# Patient Record
Sex: Female | Born: 1990 | Race: White | Hispanic: No | Marital: Married | Smoking: Former smoker
Health system: Southern US, Community
[De-identification: ages and names within clinical notes are randomized; demographics above are authoritative.]

## PROBLEM LIST (undated history)

## (undated) ENCOUNTER — Ambulatory Visit: Discharge status: Absent without leave | Payer: Self-pay

## (undated) DIAGNOSIS — F419 Anxiety disorder, unspecified: Secondary | ICD-10-CM

## (undated) DIAGNOSIS — K219 Gastro-esophageal reflux disease without esophagitis: Secondary | ICD-10-CM

---
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
## 2014-11-03 NOTE — L&D Delivery Note (Signed)
Deliver Note   Date of Delivery:   05/26/2015 Primary OB:   [facility] Gestational Age/EDD: [redacted]w[redacted]d by 05/28/2015, by Last Menstrual Period  Antepartum complications:  OB History    Gravida Para Term Preterm AB TAB SAB Ectopic Multiple Living   0 0 0 0 0 0 0      Delivered By:   Vena Austria MD  Delivery Type:   TSVD Anesthesia:     Epidural  Intrapartum complications:  GBS:    Negative (06/29 0000) Laceration:    Small bilateral labia minor - hemostatic not repaired Episiotomy:    none Placenta:    Spontaneous Estimated Blood Loss:  Baby:    Liveborn female , APGAR (1 MIN): 9   APGAR (5 MINS): 9   APGAR (10 MINS):  , weight pending   Deliver Details   At  a female Bryan Lemma) was delivered via  (Presentation: OA  ).  APGAR: 9, 9; weight pending.   Placenta status: intact, 3 Vessel Cord:  without complications: .Light meconium noted in labor    Mom to postpartum.  Baby to Couplet care / Skin to Skin.

## 2014-11-07 LAB — OB RESULTS CONSOLE GC/CHLAMYDIA
CHLAMYDIA, DNA PROBE: NEGATIVE
GC PROBE AMP, GENITAL: NEGATIVE

## 2014-12-20 LAB — OB RESULTS CONSOLE HGB/HCT, BLOOD
HCT: 36 %
Hemoglobin: 11.9 g/dL

## 2014-12-20 LAB — OB RESULTS CONSOLE VARICELLA ZOSTER ANTIBODY, IGG: Varicella: IMMUNE

## 2014-12-20 LAB — OB RESULTS CONSOLE ABO/RH: RH TYPE: POSITIVE

## 2014-12-20 LAB — OB RESULTS CONSOLE RPR: RPR: NONREACTIVE

## 2014-12-20 LAB — OB RESULTS CONSOLE HIV ANTIBODY (ROUTINE TESTING): HIV: NONREACTIVE

## 2014-12-20 LAB — OB RESULTS CONSOLE RUBELLA ANTIBODY, IGM: Rubella: IMMUNE

## 2014-12-20 LAB — OB RESULTS CONSOLE HEPATITIS B SURFACE ANTIGEN: Hepatitis B Surface Ag: NEGATIVE

## 2014-12-20 LAB — OB RESULTS CONSOLE ANTIBODY SCREEN: Antibody Screen: NEGATIVE

## 2015-05-02 LAB — OB RESULTS CONSOLE GBS: GBS: NEGATIVE

## 2015-05-26 ENCOUNTER — Inpatient Hospital Stay: Payer: Medicaid Other | Admitting: Anesthesiology

## 2015-05-26 ENCOUNTER — Inpatient Hospital Stay
Admission: EM | Admit: 2015-05-26 | Discharge: 2015-05-28 | DRG: 775 | Disposition: A | Discharge status: Home / Self Care | Payer: Medicaid Other | Attending: Obstetrics and Gynecology | Admitting: Obstetrics and Gynecology

## 2015-05-26 DIAGNOSIS — K219 Gastro-esophageal reflux disease without esophagitis: Secondary | ICD-10-CM | POA: Diagnosis present

## 2015-05-26 DIAGNOSIS — Z87891 Personal history of nicotine dependence: Secondary | ICD-10-CM | POA: Diagnosis not present

## 2015-05-26 DIAGNOSIS — Z833 Family history of diabetes mellitus: Secondary | ICD-10-CM

## 2015-05-26 DIAGNOSIS — Z3A39 39 weeks gestation of pregnancy: Secondary | ICD-10-CM | POA: Diagnosis present

## 2015-05-26 HISTORY — DX: Gastro-esophageal reflux disease without esophagitis: K21.9

## 2015-05-26 LAB — CHLAMYDIA/NGC RT PCR (ARMC ONLY)
Chlamydia Tr: NOT DETECTED
N GONORRHOEAE: NOT DETECTED

## 2015-05-26 LAB — URINE DRUG SCREEN, QUALITATIVE (ARMC ONLY)
Amphetamines, Ur Screen: NOT DETECTED
Barbiturates, Ur Screen: NOT DETECTED
Benzodiazepine, Ur Scrn: NOT DETECTED
CANNABINOID 50 NG, UR ~~LOC~~: POSITIVE — AB
Cocaine Metabolite,Ur ~~LOC~~: NOT DETECTED
MDMA (ECSTASY) UR SCREEN: NOT DETECTED
Methadone Scn, Ur: NOT DETECTED
OPIATE, UR SCREEN: NOT DETECTED
Phencyclidine (PCP) Ur S: NOT DETECTED
Tricyclic, Ur Screen: NOT DETECTED

## 2015-05-26 LAB — ABO/RH: ABO/RH(D): A POS

## 2015-05-26 LAB — CBC
HEMATOCRIT: 36.8 % (ref 35.0–47.0)
HEMOGLOBIN: 12.2 g/dL (ref 12.0–16.0)
MCH: 29.8 pg (ref 26.0–34.0)
MCHC: 33 g/dL (ref 32.0–36.0)
MCV: 90.1 fL (ref 80.0–100.0)
Platelets: 184 10*3/uL (ref 150–440)
RBC: 4.09 MIL/uL (ref 3.80–5.20)
RDW: 13.5 % (ref 11.5–14.5)
WBC: 16.4 10*3/uL — ABNORMAL HIGH (ref 3.6–11.0)

## 2015-05-26 LAB — TYPE AND SCREEN
ABO/RH(D): A POS
ANTIBODY SCREEN: NEGATIVE

## 2015-05-26 MED ORDER — DIPHENHYDRAMINE HCL 50 MG/ML IJ SOLN: 12.5000 mg | INTRAMUSCULAR | Status: DC | PRN

## 2015-05-26 MED ORDER — BUTORPHANOL TARTRATE 1 MG/ML IJ SOLN
1.0000 mg | INTRAMUSCULAR | Status: DC | PRN
  Administered 2015-05-26 (×2): 2 mg via INTRAVENOUS
  Filled 2015-05-26 (×2): qty 2

## 2015-05-26 MED ORDER — FENTANYL 2.5 MCG/ML W/ROPIVACAINE 0.2% IN NS 100 ML EPIDURAL INFUSION (ARMC-ANES)
10.0000 mL/h | EPIDURAL | Status: DC
  Administered 2015-05-26: 10 mL/h via EPIDURAL
  Filled 2015-05-26: qty 100

## 2015-05-26 MED ORDER — ONDANSETRON HCL 4 MG/2ML IJ SOLN
4.0000 mg | Freq: Four times a day (QID) | INTRAMUSCULAR | Status: DC | PRN
  Administered 2015-05-26 (×2): 4 mg via INTRAVENOUS
  Filled 2015-05-26 (×2): qty 2

## 2015-05-26 MED ORDER — CITRIC ACID-SODIUM CITRATE 334-500 MG/5ML PO SOLN: 30.0000 mL | ORAL | Status: DC | PRN

## 2015-05-26 MED ORDER — FENTANYL 2.5 MCG/ML BUPIVACAINE 1/10 % EPIDURAL INFUSION (WH - ANES): 9.0000 mL/h | INTRAMUSCULAR | Status: DC | PRN

## 2015-05-26 MED ORDER — LACTATED RINGERS IV SOLN
INTRAVENOUS | Status: DC
  Administered 2015-05-26: 14:00:00 via INTRAVENOUS

## 2015-05-26 MED ORDER — LIDOCAINE HCL (PF) 1 % IJ SOLN
30.0000 mL | INTRAMUSCULAR | Status: DC | PRN
  Filled 2015-05-26: qty 30

## 2015-05-26 MED ORDER — AMMONIA AROMATIC IN INHA
RESPIRATORY_TRACT | Status: AC
  Filled 2015-05-26: qty 10

## 2015-05-26 MED ORDER — OXYTOCIN 10 UNIT/ML IJ SOLN
INTRAMUSCULAR | Status: AC
  Filled 2015-05-26: qty 2

## 2015-05-26 MED ORDER — LIDOCAINE HCL (PF) 1 % IJ SOLN
INTRAMUSCULAR | Status: AC
  Filled 2015-05-26: qty 30

## 2015-05-26 MED ORDER — TERBUTALINE SULFATE 1 MG/ML IJ SOLN: 0.2500 mg | Freq: Once | INTRAMUSCULAR | Status: AC | PRN

## 2015-05-26 MED ORDER — EPHEDRINE 5 MG/ML INJ
10.0000 mg | INTRAVENOUS | Status: DC | PRN
  Filled 2015-05-26: qty 2

## 2015-05-26 MED ORDER — BUPIVACAINE HCL (PF) 0.25 % IJ SOLN
INTRAMUSCULAR | Status: DC | PRN
  Administered 2015-05-26: 5 mL

## 2015-05-26 MED ORDER — OXYTOCIN 40 UNITS IN LACTATED RINGERS INFUSION - SIMPLE MED: 62.5000 mL/h | INTRAVENOUS | Status: DC

## 2015-05-26 MED ORDER — OXYTOCIN BOLUS FROM INFUSION
500.0000 mL | INTRAVENOUS | Status: DC
  Administered 2015-05-26: 500 mL via INTRAVENOUS

## 2015-05-26 MED ORDER — ACETAMINOPHEN 325 MG PO TABS: 650.0000 mg | ORAL_TABLET | ORAL | Status: DC | PRN

## 2015-05-26 MED ORDER — OXYTOCIN 40 UNITS IN LACTATED RINGERS INFUSION - SIMPLE MED
1.0000 m[IU]/min | INTRAVENOUS | Status: DC
  Administered 2015-05-26: 2 m[IU]/min via INTRAVENOUS
  Administered 2015-05-26: 3 m[IU]/min via INTRAVENOUS

## 2015-05-26 MED ORDER — LACTATED RINGERS IV SOLN: 500.0000 mL | INTRAVENOUS | Status: DC | PRN

## 2015-05-26 NOTE — H&P (Signed)
Obstetric H&P   Chief Complaint: Contractions  Prenatal Care Provider: [facility]  History of Present Illness: 24 y.o. G1P0000 [redacted]w[redacted]d by 05/28/2015, presenting to L&D with contraction.  No LOF, no VB, +FM.  Pregnancy thus far uncomplicated at [facility].    ABO, Rh: A/Positive/-- (02/17 0000)  Antibody: Negative (02/17 0000)  Rubella: Immune Varicella: Immune RPR: Nonreactive (02/17 0000)  HBsAg: Negative (02/17 0000)  HIV: Non-reactive (02/17 0000)  RPR: Nonreactive (02/17 0000) 1-hr: 87 GBS: Negative (06/29 0000)   TDAP received 03/15/15  Review of Systems: 10 point review of systems negative unless otherwise noted in HPI  Past Medical History: Past Medical History  Diagnosis Date  . GERD (gastroesophageal reflux disease)     Past Surgical History: Past Surgical History  Procedure Laterality Date  . No past surgeries      Family History: Family History  Problem Relation Age of Onset  . Diabetes Maternal Grandfather   . Diabetes Paternal Grandfather     Social History: History   Social History  . Marital Status: Single    Spouse Name: N/A  . Number of Children: N/A  . Years of Education: N/A   Occupational History  . Not on file.   Social History Main Topics  . Smoking status: Former Games developer  . Smokeless tobacco: Never Used  . Alcohol Use: No  . Drug Use: 1.00 per week    Special: Marijuana  . Sexual Activity: Yes   Other Topics Concern  . Not on file   Social History Narrative  . No narrative on file    Medications: Prior to Admission medications   Medication Sig Start Date End Date Taking? Authorizing Provider  lactase (LACTAID) 3000 UNITS tablet Take by mouth 3 (three) times daily with meals.   Yes Historical Provider, MD  Prenatal Vit-Fe Fumarate-FA (PRENATAL MULTIVITAMIN) TABS tablet Take 1 tablet by mouth daily at 12 noon.   Yes Historical Provider, MD  ranitidine (ZANTAC) 150 MG tablet Take 150 mg by mouth 2 (two) times daily.   Yes Historical  Provider, MD    Allergies: No Known Allergies  Physical Exam: Vitals: Blood pressure 117/84, pulse 87, temperature 98.2 F (36.8 C), temperature source Oral, resp. rate 16, height  (1.702 m), weight 87.091 kg (192 lb), last menstrual period 08/21/2014.   FHT: 130, moderate, positive accels, no decels Toco: q19min  General: NAD HEENT: Normocephalic, anicteric Pulmonary: CTAB Cardiovascular: RRR Abdomen: Gravid,  Non-tender Leopolds: vtx Genitourinary: Dilation: 4 Effacement (%): 80 Cervical Position: Anterior Station: -1 Presentation: Vertex Exam by:: JTC  Extremities: no edema  Labs: No results found for this or any previous visit (from the past 24 hour(s)).  Assessment: 24 y.o. G1P0000 [redacted]w[redacted]d by 05/28/2015, by Last Menstrual Period with term labor  Plan: 1) Labor - expectant management, change from 3-4cm  2) Fetus - category I tracing   4) TDAP - up to date  5) Breast / Mirena  6)  Disposition - pending delivery

## 2015-05-26 NOTE — Progress Notes (Signed)
Dr. Bonney Aid at bedside.  Pt pushing effectively.    2318 Delivered female over intact perineum.

## 2015-05-26 NOTE — Progress Notes (Signed)
Pt is complete. Foley dc'd.  Pt positioned to push.  MD aware

## 2015-05-26 NOTE — Anesthesia Procedure Notes (Signed)
Epidural Patient location during procedure: OB  Staffing Anesthesiologist: Berdine Addison Performed by: anesthesiologist   Preanesthetic Checklist Completed: patient identified, site marked, surgical consent, pre-op evaluation, timeout performed, IV checked, risks and benefits discussed and monitors and equipment checked  Epidural Patient position: sitting Prep: Betadine Patient monitoring: heart rate, continuous pulse ox and blood pressure Approach: midline Location: L4-L5 Injection technique: LOR saline  Needle:  Needle type: Tuohy  Needle gauge: 18 G Needle length: 9 cm and 9 Catheter type: closed end flexible Catheter size: 20 Guage Test dose: negative and 1.5% lidocaine with Epi 1:200 K  Assessment Sensory level: T10 Events: blood not aspirated, injection not painful, no injection resistance, negative IV test and no paresthesia  Additional Notes   Patient tolerated the insertion well without complications. 1707 catheter in. 1709 test dose. 1713 5 ml 0.25% marcaine.Reason for block:procedure for pain

## 2015-05-26 NOTE — OB Triage Note (Signed)
24 y.o. female presents today with complaint of contractions .  States that the contractions started at 0500. The pain has been going on for 4 hours. States that this is a 8/10 on the pain scale and she feels it mainly in her lower belly and back. She states that moving makes it better and everything makes it worse. At home she has tried walking to make it better.  Was checked at office yesterday and states she was told she was 2 cm, but then the doctor stripped her membranes. Denies leaking fluid today.  States she has had some mucus and old blood from her exam yesterday, but denies active bleeding.  States baby has been moving well.  Has had some nausea this morning, but states this is no different than every day as she has had nausea throughout her pregnancy.  Has a h/o marijuana use throughout this pregnancy and states she last used about a week ago to help her nausea.

## 2015-05-26 NOTE — Anesthesia Preprocedure Evaluation (Signed)
Anesthesia Evaluation  Patient identified by MRN, date of birth, ID band Patient awake    Reviewed: Allergy & Precautions, NPO status , Patient's Chart, lab work & pertinent test results, reviewed documented beta blocker date and time   Airway Mallampati: II  TM Distance: >3 FB     Dental  (+) Chipped   Pulmonary former smoker,          Cardiovascular     Neuro/Psych    GI/Hepatic GERD-  ,  Endo/Other    Renal/GU      Musculoskeletal   Abdominal   Peds  Hematology   Anesthesia Other Findings   Reproductive/Obstetrics                             Anesthesia Physical Anesthesia Plan  ASA: II  Anesthesia Plan: Epidural   Post-op Pain Management:    Induction:   Airway Management Planned:   Additional Equipment:   Intra-op Plan:   Post-operative Plan:   Informed Consent: I have reviewed the patients History and Physical, chart, labs and discussed the procedure including the risks, benefits and alternatives for the proposed anesthesia with the patient or authorized representative who has indicated his/her understanding and acceptance.     Plan Discussed with: CRNA  Anesthesia Plan Comments:         Anesthesia Quick Evaluation

## 2015-05-26 NOTE — Progress Notes (Signed)
Subjective:  Increasingly painful contraction  Objective:   Vitals: Blood pressure 117/84, pulse 87, temperature 98.2 F (36.8 C), temperature source Oral, resp. rate 16, height  (1.702 m), weight 87.091 kg (192 lb), last menstrual period 08/21/2014. General:  Abdomen: Cervical Exam:  Dilation: 4 Effacement (%): 80 Cervical Position: Anterior Station: -1 Presentation: Vertex Exam by:: JTC AROM: light meconium FHT: 110, moderate variables, +accles, no decels Toco: q73min  Results for orders placed or performed during the hospital encounter of 05/26/15 (from the past 24 hour(s))  CBC     Status: Abnormal   Collection Time: 05/26/15  1:44 PM  Result Value Ref Range   WBC 16.4 (H) 3.6 - 11.0 K/uL   RBC 4.09 3.80 - 5.20 MIL/uL   Hemoglobin 12.2 12.0 - 16.0 g/dL   HCT 16.1 09.6 - 04.5 %   MCV 90.1 80.0 - 100.0 fL   MCH 29.8 26.0 - 34.0 pg   MCHC 33.0 32.0 - 36.0 g/dL   RDW 40.9 81.1 - 91.4 %   Platelets 184 150 - 440 K/uL  Chlamydia/NGC rt PCR ([facility] only)     Status: None   Collection Time: 05/26/15  1:51 PM  Result Value Ref Range   Specimen source GC/Chlam URINE, RANDOM    Chlamydia Tr NOT DETECTED NOT DETECTED   N gonorrhoeae NOT DETECTED NOT DETECTED    Assessment:   24 y.o. G1P0000 [redacted]w[redacted]d term labor   Plan:   1) Labor - AROM light meconium  2) Fetus - cat I tracing

## 2015-05-26 NOTE — Discharge Summary (Signed)
Obstetrical Discharge Summary  Date of Admission: 05/26/2015 Date of Discharge: 05/28/2015  Primary OB: [facility]  Gestational Age at Delivery: [redacted]w[redacted]d   Antepartum complications: h/o THC use Reason for Admission: term labor Date of Delivery: 05/26/2015  Delivered By: Dr. Vena Austria Delivery Type: spontaneous vaginal delivery Intrapartum complications/course: None Anesthesia: epidural Placenta: delivered Laceration: none Episiotomy: none Baby: Liveborn female, APGARs9/9, weight 2885 g.   Postpartum course: uncomplicated Disposition: Home  Rh Immune globulin given: not applicable Rubella vaccine given: not applicable Tdap vaccine given in AP or PP setting: yes Flu vaccine given in AP or PP setting: not applicable  Contraception: IUD, no bridge; pt told to come by office to sign IUD order form  Prenatal/Postnatal Panel: A POS//Rubella Immune//Varicella Immune//RPR negative//HIV negative/HepB Surface Ag negative//pap no abnormalities (date: 11/2014)//plans to breastfeed  Plan:  Alexandra Olson was discharged to home in good condition. Follow-up appointment with Dr. Bonney Aid in 4 weeks  Discharge Medications:   Medication List    TAKE these medications        ibuprofen 600 MG tablet  Commonly known as:  ADVIL,MOTRIN  Take 1 tablet (600 mg total) by mouth every 6 (six) hours as needed.     lactase 3000 UNITS tablet  Commonly known as:  LACTAID  Take by mouth 3 (three) times daily with meals.     levonorgestrel 1.5 MG tablet  Commonly known as:  PLAN B 1-STEP  Take 1 tablet (1.5 mg total) by mouth once.     prenatal multivitamin Tabs tablet  Take 1 tablet by mouth daily at 12 noon.     ranitidine 150 MG tablet  Commonly known as:  ZANTAC  Take 150 mg by mouth 2 (two) times daily.        Alexandra Copa MD [facility]  Pager: [phone]

## 2015-05-27 LAB — CBC
HCT: 33.4 % — ABNORMAL LOW (ref 35.0–47.0)
HEMOGLOBIN: 11.1 g/dL — AB (ref 12.0–16.0)
MCH: 30.1 pg (ref 26.0–34.0)
MCHC: 33.2 g/dL (ref 32.0–36.0)
MCV: 90.7 fL (ref 80.0–100.0)
Platelets: 148 10*3/uL — ABNORMAL LOW (ref 150–440)
RBC: 3.68 MIL/uL — ABNORMAL LOW (ref 3.80–5.20)
RDW: 13.4 % (ref 11.5–14.5)
WBC: 22.1 10*3/uL — AB (ref 3.6–11.0)

## 2015-05-27 LAB — RPR: RPR Ser Ql: NONREACTIVE

## 2015-05-27 MED ORDER — OXYCODONE-ACETAMINOPHEN 5-325 MG PO TABS: 2.0000 | ORAL_TABLET | ORAL | Status: DC | PRN

## 2015-05-27 MED ORDER — LANOLIN HYDROUS EX OINT: TOPICAL_OINTMENT | CUTANEOUS | Status: DC | PRN

## 2015-05-27 MED ORDER — OXYCODONE-ACETAMINOPHEN 5-325 MG PO TABS: 1.0000 | ORAL_TABLET | ORAL | Status: DC | PRN

## 2015-05-27 MED ORDER — ONDANSETRON HCL 4 MG PO TABS: 4.0000 mg | ORAL_TABLET | ORAL | Status: DC | PRN

## 2015-05-27 MED ORDER — DIBUCAINE 1 % RE OINT: 1.0000 "application " | TOPICAL_OINTMENT | RECTAL | Status: DC | PRN

## 2015-05-27 MED ORDER — IBUPROFEN 600 MG PO TABS
600.0000 mg | ORAL_TABLET | Freq: Four times a day (QID) | ORAL | Status: DC
  Administered 2015-05-27 – 2015-05-28 (×5): 600 mg via ORAL
  Filled 2015-05-27 (×6): qty 1

## 2015-05-27 MED ORDER — PRENATAL MULTIVITAMIN CH
1.0000 | ORAL_TABLET | Freq: Every day | ORAL | Status: DC
  Administered 2015-05-27: 1 via ORAL
  Filled 2015-05-27: qty 1

## 2015-05-27 MED ORDER — SENNOSIDES-DOCUSATE SODIUM 8.6-50 MG PO TABS
2.0000 | ORAL_TABLET | ORAL | Status: DC
  Administered 2015-05-28: 2 via ORAL
  Filled 2015-05-27 (×2): qty 2

## 2015-05-27 MED ORDER — DIPHENHYDRAMINE HCL 25 MG PO CAPS: 25.0000 mg | ORAL_CAPSULE | Freq: Four times a day (QID) | ORAL | Status: DC | PRN

## 2015-05-27 MED ORDER — BENZOCAINE-MENTHOL 20-0.5 % EX AERO
1.0000 "application " | INHALATION_SPRAY | CUTANEOUS | Status: DC | PRN
  Administered 2015-05-27: 1 via TOPICAL
  Filled 2015-05-27: qty 56

## 2015-05-27 MED ORDER — WITCH HAZEL-GLYCERIN EX PADS: 1.0000 "application " | MEDICATED_PAD | CUTANEOUS | Status: DC | PRN

## 2015-05-27 MED ORDER — SIMETHICONE 80 MG PO CHEW: 80.0000 mg | CHEWABLE_TABLET | ORAL | Status: DC | PRN

## 2015-05-27 MED ORDER — ACETAMINOPHEN 325 MG PO TABS: 650.0000 mg | ORAL_TABLET | ORAL | Status: DC | PRN

## 2015-05-27 MED ORDER — ONDANSETRON HCL 4 MG/2ML IJ SOLN: 4.0000 mg | INTRAMUSCULAR | Status: DC | PRN

## 2015-05-27 NOTE — Progress Notes (Signed)
Pt assisted to bathroom to void.  Shown pericare.  Clean gown and pad with ice pack on.  Pt transferred via w/c to PP in stable condition.

## 2015-05-27 NOTE — Lactation Note (Addendum)
This note was copied from the chart of Alexandra Congo. Lactation Consultation Note  Patient Name: Alexandra Olson [id number] Date: 05/27/2015 Reason for consult: Initial assessment;Other (Comment) (+ cannabis result 7/23) MOM counselled to pump and dump breastmilk until she has neg urine drug screen, she is agreeable to this, given info on mariquana use while breastfeeding and risks of neurological harm to infant, pt is on [address] and advised to get electric breast pump from [facility], mom set up with hospital grade medela sym[employer] pump and has started pumping breasts, advised to pump every 3 hrs to initiate milk production. Pt instructed to dump any breast milk she obtains after pumping breasts.    Maternal Data Does the patient have breastfeeding experience prior to this delivery?: No  Feeding Feeding Type: Bottle Fed - Formula Nipple Type: Slow - flow  LATCH Score/Interventions                      Lactation Tools Discussed/Used Tools: Pump;Bottle WIC Program: Yes Pump Review: Setup, frequency, and cleaning Initiated by:: Clair Gulling RNC IBCLC Date initiated:: 05/27/15   Consult Status Consult Status: PRN    Dyann Kief 05/27/2015, 12:31 PM

## 2015-05-27 NOTE — Progress Notes (Signed)
Partial bed bath given as newborn had large stool across mothers chest and abdomen.

## 2015-05-27 NOTE — Progress Notes (Signed)
Subjective:  Doing well no problems, minimal lochia  Objective:   Blood pressure 114/71, pulse 78, temperature 97.8 F (36.6 C), temperature source Oral, resp. rate 20, height  (1.702 m), weight 87.091 kg (192 lb), last menstrual period 08/21/2014, SpO2 98 %, unknown if currently breastfeeding.  General: NAD Pulmonary: no increased work of breathing Abdomen: non-distended, non-tender, fundus firm at level of umbilicus Extremities: no edema, no erythema, no tenderness  Results for orders placed or performed during the hospital encounter of 05/26/15 (from the past 72 hour(s))  CBC     Status: Abnormal   Collection Time: 05/26/15  1:44 PM  Result Value Ref Range   WBC 16.4 (H) 3.6 - 11.0 K/uL   RBC 4.09 3.80 - 5.20 MIL/uL   Hemoglobin 12.2 12.0 - 16.0 g/dL   HCT 16.1 09.6 - 04.5 %   MCV 90.1 80.0 - 100.0 fL   MCH 29.8 26.0 - 34.0 pg   MCHC 33.0 32.0 - 36.0 g/dL   RDW 40.9 81.1 - 91.4 %   Platelets 184 150 - 440 K/uL  RPR     Status: None   Collection Time: 05/26/15  1:44 PM  Result Value Ref Range   RPR Ser Ql Non Reactive Non Reactive    Comment: (NOTE) Performed At: [facility] [address] [place], [place] [id number] Mila Homer MD [id number]   Type and screen     Status: None   Collection Time: 05/26/15  1:48 PM  Result Value Ref Range   ABO/RH(D) A POS    Antibody Screen NEG    Sample Expiration 05/29/2015   ABO/Rh     Status: None   Collection Time: 05/26/15  1:49 PM  Result Value Ref Range   ABO/RH(D) A POS   Chlamydia/NGC rt PCR ([facility] only)     Status: None   Collection Time: 05/26/15  1:51 PM  Result Value Ref Range   Specimen source GC/Chlam URINE, RANDOM    Chlamydia Tr NOT DETECTED NOT DETECTED   N gonorrhoeae NOT DETECTED NOT DETECTED    Comment: (NOTE) 100  This methodology has not been evaluated in pregnant women or in 200  patients with a history of hysterectomy. 300 400  This methodology will not be performed on patients  less than 18  years of age.   Urine Drug Screen, Qualitative ([facility] only)     Status: Abnormal   Collection Time: 05/26/15  8:52 PM  Result Value Ref Range   Tricyclic, Ur Screen NONE DETECTED NONE DETECTED   Amphetamines, Ur Screen NONE DETECTED NONE DETECTED   MDMA (Ecstasy)Ur Screen NONE DETECTED NONE DETECTED   Cocaine Metabolite,Ur [place] NONE DETECTED NONE DETECTED   Opiate, Ur Screen NONE DETECTED NONE DETECTED   Phencyclidine (PCP) Ur S NONE DETECTED NONE DETECTED   Cannabinoid 50 Ng, Ur [place] POSITIVE (A) NONE DETECTED   Barbiturates, Ur Screen NONE DETECTED NONE DETECTED   Benzodiazepine, Ur Scrn NONE DETECTED NONE DETECTED   Methadone Scn, Ur NONE DETECTED NONE DETECTED    Comment: (NOTE) 100  Tricyclics, urine               Cutoff 1000 ng/mL 200  Amphetamines, urine             Cutoff 1000 ng/mL 300  MDMA (Ecstasy), urine           Cutoff 500 ng/mL 400  Cocaine Metabolite, urine       Cutoff 300 ng/mL 500  Opiate,  urine                   Cutoff 300 ng/mL 600  Phencyclidine (PCP), urine      Cutoff 25 ng/mL 700  Cannabinoid, urine              Cutoff 50 ng/mL 800  Barbiturates, urine             Cutoff 200 ng/mL 900  Benzodiazepine, urine           Cutoff 200 ng/mL 1000 Methadone, urine                Cutoff 300 ng/mL 1100 1200 The urine drug screen provides only a preliminary, unconfirmed 1300 analytical test result and should not be used for non-medical 1400 purposes. Clinical consideration and professional judgment should 1500 be applied to any positive drug screen result due to possible 1600 interfering substances. A more specific alternate chemical method 1700 must be used in order to obtain a confirmed analytical result.  1800 Gas chromato graphy / mass spectrometry (GC/MS) is the preferred 1900 confirmatory method.   CBC     Status: Abnormal   Collection Time: 05/27/15  5:24 AM  Result Value Ref Range   WBC 22.1 (H) 3.6 - 11.0 K/uL   RBC 3.68 (L) 3.80 - 5.20 MIL/uL    Hemoglobin 11.1 (L) 12.0 - 16.0 g/dL   HCT 16.1 (L) 09.6 - 04.5 %   MCV 90.7 80.0 - 100.0 fL   MCH 30.1 26.0 - 34.0 pg   MCHC 33.2 32.0 - 36.0 g/dL   RDW 40.9 81.1 - 91.4 %   Platelets 148 (L) 150 - 440 K/uL    Assessment:   23 y.o. G1P1001 postpartum day # 1  Plan:  1) Continue routine postpartum care  2) A pos / RI / VZI   3) TDAP 03/15/15  4) Disposition home PPD2

## 2015-05-28 MED ORDER — IBUPROFEN 600 MG PO TABS: 600.0000 mg | ORAL_TABLET | Freq: Four times a day (QID) | ORAL | Status: DC | PRN

## 2015-05-28 MED ORDER — LEVONORGESTREL 1.5 MG PO TABS: 1.5000 mg | ORAL_TABLET | Freq: Once | ORAL | Status: DC

## 2015-05-28 NOTE — Discharge Instructions (Signed)
Vaginal Delivery, Care After Refer to this sheet in the next few weeks. These discharge instructions provide you with information on caring for yourself after delivery. Your caregiver may also give you specific instructions. Your treatment has been planned according to the most current medical practices available, but problems sometimes occur. Call your caregiver if you have any problems or questions after you go home. HOME CARE INSTRUCTIONS 1. Take over-the-counter or prescription medicines only as directed by your caregiver or pharmacist. 2. Do not drink alcohol, especially if you are breastfeeding or taking medicine to relieve pain. 3. Do not smoke tobacco. 4. Continue to use good perineal care. Good perineal care includes: 1. Wiping your perineum from back to front 2. Keeping your perineum clean. 3. You can do sitz baths twice a day, to help keep this area clean 5. Do not use tampons, douche or have sex until your caregiver says it is okay. 6. Shower only and avoid sitting in submerged water, aside from sitz baths 7. Wear a well-fitting bra that provides breast support. 8. Eat healthy foods. 9. Drink enough fluids to keep your urine clear or pale yellow. 10. Eat high-fiber foods such as whole grain cereals and breads, brown rice, beans, and fresh fruits and vegetables every day. These foods may help prevent or relieve constipation. 11. Avoid constipation with high fiber foods or medications, such as miralax or metamucil 12. Follow your caregiver's recommendations regarding resumption of activities such as climbing stairs, driving, lifting, exercising, or traveling. 13. Talk to your caregiver about resuming sexual activities. Resumption of sexual activities is dependent upon your risk of infection, your rate of healing, and your comfort and desire to resume sexual activity. 14. Try to have someone help you with your household activities and your newborn for at least a few days after you leave  the hospital. 15. Rest as much as possible. Try to rest or take a nap when your newborn is sleeping. 16. Increase your activities gradually. 17. Keep all of your scheduled postpartum appointments. It is very important to keep your scheduled follow-up appointments. At these appointments, your caregiver will be checking to make sure that you are healing physically and emotionally. SEEK MEDICAL CARE IF:   You are passing large clots from your vagina. Save any clots to show your caregiver.  You have a foul smelling discharge from your vagina.  You have trouble urinating.  You are urinating frequently.  You have pain when you urinate.  You have a change in your bowel movements.  You have increasing redness, pain, or swelling near your vaginal incision (episiotomy) or vaginal tear.  You have pus draining from your episiotomy or vaginal tear.  Your episiotomy or vaginal tear is separating.  You have painful, hard, or reddened breasts.  You have a severe headache.  You have blurred vision or see spots.  You feel sad or depressed.  You have thoughts of hurting yourself or your newborn.  You have questions about your care, the care of your newborn, or medicines.  You are dizzy or light-headed.  You have a rash.  You have nausea or vomiting.  You were breastfeeding and have not had a menstrual period within 12 weeks after you stopped breastfeeding.  You are not breastfeeding and have not had a menstrual period by the 12th week after delivery.  You have a fever. SEEK IMMEDIATE MEDICAL CARE IF:   You have persistent pain.  You have chest pain.  You have shortness of breath.    You faint.  You have leg pain.  You have stomach pain.  Your vaginal bleeding saturates two or more sanitary pads in 1 hour. MAKE SURE YOU:   Understand these instructions.  Will watch your condition.  Will get help right away if you are not doing well or get worse. Document Released:  10/17/2000 Document Revised: 03/06/2014 Document Reviewed: 06/16/2012 ExitCare Patient Information 2015 [employer]. This information is not intended to replace advice given to you by your health care provider. Make sure you discuss any questions you have with your health care provider.  Sitz Bath A sitz bath is a warm water bath taken in the sitting position. The water covers only the hips and butt (buttocks). We recommend using one that fits in the toilet, to help with ease of use and cleanliness. It may be used for either healing or cleaning purposes. Sitz baths are also used to relieve pain, itching, or muscle tightening (spasms). The water may contain medicine. Moist heat will help you heal and relax.  HOME CARE  Take 3 to 4 sitz baths a day. 18. Fill the bathtub half-full with warm water. 19. Sit in the water and open the drain a little. 20. Turn on the warm water to keep the tub half-full. Keep the water running constantly. 21. Soak in the water for 15 to 20 minutes. 22. After the sitz bath, pat the affected area dry. GET HELP RIGHT AWAY IF: You get worse instead of better. Stop the sitz baths if you get worse. MAKE SURE YOU:  Understand these instructions.  Will watch your condition.  Will get help right away if you are not doing well or get worse. Document Released: 11/27/2004 Document Revised: 07/14/2012 Document Reviewed: 02/17/2011 ExitCare Patient Information 2015 [employer]. This information is not intended to replace advice given to you by your health care provider. Make sure you discuss any questions you have with your health care provider.    

## 2015-05-28 NOTE — Progress Notes (Signed)
Patient discharge to home via wheelchair with spouse and baby. 

## 2015-05-28 NOTE — Clinical Social Work Note (Signed)
CLINICAL SOCIAL WORK MATERNAL/CHILD NOTE  Patient Details  Name: Alexandra Olson MRN: [id number] Date of Birth: 05/26/2013  Date: 05/28/2015  Clinical Social Worker Initiating Note: Emelia Loron, LCSWDate/ Time Initiated: 05/28/15/1115   Child's Name: Alexandra Olson    Legal Guardian: Mother (Father- Alexandra Olson)   Need for Interpreter: None   Date of Referral: 05/27/15   Reason for Referral: Current Substance Use/Substance Use During Pregnancy    Referral Source: Physician   Address: [address], [place], [place]  Phone number: [phone]   Household Members: Self, Significant Other   Natural Supports (not living in the home): Parent   Professional Supports:None   Employment:Part-time (Mother worked part-time and Father works full time)   Type of Work: Designer, 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 Mother stated she used to marijuana to help with nausea during pregnancy. Mother stated she doesn't plan to continue marijuana use. CSW explained that if baby tests positive a report will be made to DSS. Parents stated they understood. The family reports that basic needs are met and they have a strong support network.   CSW Plan/Description: No Further Intervention Required/No Barriers to Discharge   [facility], [place], [place] 05/28/2015, 1:40  PM

## 2015-05-28 NOTE — Progress Notes (Signed)
Daily Post Partum Note  Alexandra Olson is a 24 y.o. G1P1001 PPD#2 s/p  SVd/IP  @ [redacted]w[redacted]d  Pregnancy c/b h/o THC use  24hr/overnight events:  No events  Subjective:  Meeting all PP goals  Objective:    Current Vital Signs 24h Vital Sign Ranges  T 98.3 F (36.8 C) Temp  Avg: 98.2 F (36.8 C)  Min: 98 F (36.7 C)  Max: 98.6 F (37 C)  BP 114/72 mmHg BP  Min: 105/74  Max: 134/78  HR 85 Pulse  Avg: 76.6  Min: 64  Max: 85  RR 18 Resp  Avg: 19.3  Min: 18  Max: 20  SaO2 99 % RA SpO2  Avg: 99 %  Min: 98 %  Max: 100 %       24 Hour I/O Current Shift I/O  Time Ins Outs 07/24 0701 - 07/25 0700 In: -  Out: 200 [Urine:200]      General: NAD Abdomen: FF below the umbilicus, NTTP Perineum: deferred Skin:  Warm and dry.  Cardiovascular:Regular rate and rhythm. Respiratory:  Clear to auscultation bilateral. Normal respiratory effort Extremities: no c/c/e  Medications Current Facility-Administered Medications  Medication Dose Route Frequency Provider Last Rate Last Dose  . acetaminophen (TYLENOL) tablet 650 mg  650 mg Oral Q4H PRN Vena Austria, MD      . benzocaine-Menthol (DERMOPLAST) 20-0.5 % topical spray 1 application  1 application Topical PRN Vena Austria, MD   1 application at 05/27/15 0411  . witch hazel-glycerin (TUCKS) pad 1 application  1 application Topical PRN Vena Austria, MD       And  . dibucaine (NUPERCAINAL) 1 % rectal ointment 1 application  1 application Rectal PRN Vena Austria, MD      . diphenhydrAMINE (BENADRYL) capsule 25 mg  25 mg Oral Q6H PRN Vena Austria, MD      . ibuprofen (ADVIL,MOTRIN) tablet 600 mg  600 mg Oral 4 times per day Vena Austria, MD   600 mg at 05/28/15 [phone]  . lanolin ointment   Topical PRN Vena Austria, MD      . ondansetron [facility]) tablet 4 mg  4 mg Oral Q4H PRN Vena Austria, MD       Or  . ondansetron [facility]) injection 4 mg  4 mg Intravenous Q4H PRN Vena Austria, MD      . oxyCODONE-acetaminophen  (PERCOCET/ROXICET) 5-325 MG per tablet 1 tablet  1 tablet Oral Q4H PRN Vena Austria, MD      . oxyCODONE-acetaminophen (PERCOCET/ROXICET) 5-325 MG per tablet 2 tablet  2 tablet Oral Q4H PRN Vena Austria, MD      . prenatal multivitamin tablet 1 tablet  1 tablet Oral Q1200 Vena Austria, MD   1 tablet at 05/27/15 0945  . senna-docusate (Senokot-S) tablet 2 tablet  2 tablet Oral Q24H Vena Austria, MD   2 tablet at 05/28/15 [phone]  . simethicone (MYLICON) chewable tablet 80 mg  80 mg Oral PRN Vena Austria, MD         Recent Labs Lab 05/26/15 1344 05/27/15 0524  WBC 16.4* 22.1*  HGB 12.2 11.1*  HCT 36.8 33.4*  PLT 184 148*   UDS: +THC  Radiology: none  Assessment & Plan:  Doing well *Postpartum/postop: routine care. D/w pt to come by office to sign IUD form this week *dispo: later today  A POS / Rubella Immune / RPR negative / HIV negative / HepBsAg negative / Tdap UTD: Yes/pap neg, 11-2014 / Breast  / IUD, no  bridge / Follow up: Johnsie Kindred MD [facility] Pager [phone]

## 2015-05-30 NOTE — Clinical Social Work Note (Signed)
Patient's newborn's urine drug screen came back positive for marijuana. CSW has made a DSS CPS report to [facility] this morning. York Spaniel MSW,LCSW [phone]

## 2015-11-26 ENCOUNTER — Emergency Department: Payer: Medicaid Other

## 2015-11-26 ENCOUNTER — Encounter: Payer: Self-pay | Admitting: Emergency Medicine

## 2015-11-26 ENCOUNTER — Emergency Department
Admission: EM | Admit: 2015-11-26 | Discharge: 2015-11-26 | Disposition: A | Discharge status: Home / Self Care | Payer: Medicaid Other | Attending: Emergency Medicine | Admitting: Emergency Medicine

## 2015-11-26 DIAGNOSIS — L309 Dermatitis, unspecified: Secondary | ICD-10-CM | POA: Diagnosis not present

## 2015-11-26 DIAGNOSIS — Z87891 Personal history of nicotine dependence: Secondary | ICD-10-CM | POA: Insufficient documentation

## 2015-11-26 DIAGNOSIS — J069 Acute upper respiratory infection, unspecified: Secondary | ICD-10-CM | POA: Insufficient documentation

## 2015-11-26 DIAGNOSIS — R05 Cough: Secondary | ICD-10-CM | POA: Diagnosis present

## 2015-11-26 LAB — RAPID INFLUENZA A&B ANTIGENS: Influenza A (ARMC): NEGATIVE

## 2015-11-26 LAB — RAPID INFLUENZA A&B ANTIGENS (ARMC ONLY): INFLUENZA B (ARMC): NEGATIVE

## 2015-11-26 MED ORDER — HALOBETASOL PROPIONATE 0.05 % EX CREA: TOPICAL_CREAM | Freq: Two times a day (BID) | CUTANEOUS | Status: DC

## 2015-11-26 MED ORDER — HYDROCOD POLST-CPM POLST ER 10-8 MG/5ML PO SUER: 5.0000 mL | Freq: Two times a day (BID) | ORAL | Status: DC

## 2015-11-26 NOTE — ED Notes (Signed)
Cough and congestion for 5 days now.

## 2015-11-26 NOTE — Discharge Instructions (Signed)
Upper Respiratory Infection, Adult Most upper respiratory infections (URIs) are a viral infection of the air passages leading to the lungs. A URI affects the nose, throat, and upper air passages. The most common type of URI is nasopharyngitis and is typically referred to as "the common cold." URIs run their course and usually go away on their own. Most of the time, a URI does not require medical attention, but sometimes a bacterial infection in the upper airways can follow a viral infection. This is called a secondary infection. Sinus and middle ear infections are common types of secondary upper respiratory infections. Bacterial pneumonia can also complicate a URI. A URI can worsen asthma and chronic obstructive pulmonary disease (COPD). Sometimes, these complications can require emergency medical care and may be life threatening.  CAUSES Almost all URIs are caused by viruses. A virus is a type of germ and can spread from one person to another.  RISKS FACTORS You may be at risk for a URI if:   You smoke.   You have chronic heart or lung disease.  You have a weakened defense (immune) system.   You are very young or very old.   You have nasal allergies or asthma.  You work in crowded or poorly ventilated areas.  You work in health care facilities or schools. SIGNS AND SYMPTOMS  Symptoms typically develop 2-3 days after you come in contact with a cold virus. Most viral URIs last 7-10 days. However, viral URIs from the influenza virus (flu virus) can last 14-18 days and are typically more severe. Symptoms may include:   Runny or stuffy (congested) nose.   Sneezing.   Cough.   Sore throat.   Headache.   Fatigue.   Fever.   Loss of appetite.   Pain in your forehead, behind your eyes, and over your cheekbones (sinus pain).  Muscle aches.  DIAGNOSIS  Your health care provider may diagnose a URI by:  Physical exam.  Tests to check that your symptoms are not due to  another condition such as:  Strep throat.  Sinusitis.  Pneumonia.  Asthma. TREATMENT  A URI goes away on its own with time. It cannot be cured with medicines, but medicines may be prescribed or recommended to relieve symptoms. Medicines may help:  Reduce your fever.  Reduce your cough.  Relieve nasal congestion. HOME CARE INSTRUCTIONS   Take medicines only as directed by your health care provider.   Gargle warm saltwater or take cough drops to comfort your throat as directed by your health care provider.  Use a warm mist humidifier or inhale steam from a shower to increase air moisture. This may make it easier to breathe.  Drink enough fluid to keep your urine clear or pale yellow.   Eat soups and other clear broths and maintain good nutrition.   Rest as needed.   Return to work when your temperature has returned to normal or as your health care provider advises. You may need to stay home longer to avoid infecting others. You can also use a face mask and careful hand washing to prevent spread of the virus.  Increase the usage of your inhaler if you have asthma.   Do not use any tobacco products, including cigarettes, chewing tobacco, or electronic cigarettes. If you need help quitting, ask your health care provider. PREVENTION  The best way to protect yourself from getting a cold is to practice good hygiene.   Avoid oral or hand contact with people with cold   symptoms.   Wash your hands often if contact occurs.  There is no clear evidence that vitamin C, vitamin E, echinacea, or exercise reduces the chance of developing a cold. However, it is always recommended to get plenty of rest, exercise, and practice good nutrition.  SEEK MEDICAL CARE IF:   You are getting worse rather than better.   Your symptoms are not controlled by medicine.   You have chills.  You have worsening shortness of breath.  You have brown or red mucus.  You have yellow or brown nasal  discharge.  You have pain in your face, especially when you bend forward.  You have a fever.  You have swollen neck glands.  You have pain while swallowing.  You have white areas in the back of your throat. SEEK IMMEDIATE MEDICAL CARE IF:   You have severe or persistent:  Headache.  Ear pain.  Sinus pain.  Chest pain.  You have chronic lung disease and any of the following:  Wheezing.  Prolonged cough.  Coughing up blood.  A change in your usual mucus.  You have a stiff neck.  You have changes in your:  Vision.  Hearing.  Thinking.  Mood. MAKE SURE YOU:   Understand these instructions.  Will watch your condition.  Will get help right away if you are not doing well or get worse.   This information is not intended to replace advice given to you by your health care provider. Make sure you discuss any questions you have with your health care provider.   Document Released: 04/15/2001 Document Revised: 03/06/2015 Document Reviewed: 01/25/2014 Elsevier Interactive Patient Education 2016 [employer]  

## 2015-11-26 NOTE — ED Provider Notes (Addendum)
[facility] Emergency Department Provider Note     Time seen: ----------------------------------------- 9:56 AM on 11/26/2015 -----------------------------------------    I have reviewed the triage vital signs and the nursing notes.   HISTORY  Chief Complaint Cough and Nasal Congestion    HPI Alexandra Olson is a 25 y.o. female brought to the ER for cough and congestion for 5 days. Patient states a few days ago she had a fever, she denies chest pain or shortness of breath. Patient denies any sore throat at this time. She's also concerned because her 27-month-old is ill. Nothing makes her symptoms better or worse.   Past Medical History  Diagnosis Date  . GERD (gastroesophageal reflux disease)     Patient Active Problem List   Diagnosis Date Noted  . Labor and delivery, indication for care 05/26/2015    Past Surgical History  Procedure Laterality Date  . No past surgeries      Allergies Review of patient's allergies indicates no known allergies.  Social History Social History  Substance Use Topics  . Smoking status: Former Games developer  . Smokeless tobacco: Never Used  . Alcohol Use: No    Review of Systems Constitutional: Negative for fever. Eyes: Negative for visual changes. ENT: Negative for sore throat. Cardiovascular: Negative for chest pain. Respiratory: Positive for cough congestion Gastrointestinal: Negative for abdominal pain, vomiting and diarrhea. Genitourinary: Negative for dysuria. Musculoskeletal: Negative for back pain. Skin: Negative for rash. Neurological: Negative for headaches, focal weakness or numbness.  10-point ROS otherwise negative.  ____________________________________________   PHYSICAL EXAM:  VITAL SIGNS: ED Triage Vitals  Enc Vitals Group     BP 11/26/15 0934 131/91 mmHg     Pulse Rate 11/26/15 0934 83     Resp --      Temp 11/26/15 0934 98.6 F (37 C)     Temp Source 11/26/15 0934 Oral      SpO2 11/26/15 0934 95 %     Weight 11/26/15 0934 150 lb (68.04 kg)     Height 11/26/15 0934  (1.702 m)     Head Cir --      Peak Flow --      Pain Score --      Pain Loc --      Pain Edu? --      Excl. in GC? --     Constitutional: Alert and oriented. Well appearing and in no distress. Eyes: Conjunctivae are normal. PERRL. Normal extraocular movements. ENT   Head: Normocephalic and atraumatic.   Nose: No congestion/rhinnorhea.   Mouth/Throat: Mucous membranes are moist.   Neck: No stridor. Cardiovascular: Normal rate, regular rhythm. Normal and symmetric distal pulses are present in all extremities. No murmurs, rubs, or gallops. Respiratory: Normal respiratory effort without tachypnea nor retractions. Breath sounds are clear and equal bilaterally. No wheezes/rales/rhonchi. Gastrointestinal: Soft and nontender. No distention. No abdominal bruits.  Musculoskeletal: Nontender with normal range of motion in all extremities. No joint effusions.  No lower extremity tenderness nor edema. Neurologic:  Normal speech and language. No gross focal neurologic deficits are appreciated. Speech is normal. No gait instability. Skin: Patient has eczematous-looking skin on her hands particularly the dorsal aspect and flexor surface of her elbows Psychiatric: Mood and affect are normal. Speech and behavior are normal. Patient exhibits appropriate insight and judgment. ____________________________________________  ED COURSE:  Pertinent labs & imaging results that were available during my care of the patient were reviewed by me and considered in my medical decision  making (see chart for details). Patient is in no acute distress, will check a chest x-ray and check for influenza. ____________________________________________    LABS (pertinent positives/negatives)  Labs Reviewed  RAPID INFLUENZA A&B ANTIGENS ([facility] ONLY)    RADIOLOGY  Chest x-ray IMPRESSION: 1. Airway thickening is  present, suggesting bronchitis or reactive airways disease. ____________________________________________  FINAL ASSESSMENT AND PLAN  URI, eczema  Plan: Patient with labs and imaging as dictated above. Patient with viral etiology, will prescribe Tussionex as needed for cough and congestion. I do not see a need for antibiotics at this time.   Emily Filbert, MD   Emily Filbert, MD 11/26/15 1055  Emily Filbert, MD 11/26/15 [phone]

## 2015-12-05 HISTORY — PX: WISDOM TOOTH EXTRACTION: SHX21

## 2016-01-19 ENCOUNTER — Emergency Department
Admission: EM | Admit: 2016-01-19 | Discharge: 2016-01-19 | Disposition: A | Discharge status: Home / Self Care | Payer: Medicaid Other | Attending: Emergency Medicine | Admitting: Emergency Medicine

## 2016-01-19 ENCOUNTER — Encounter: Payer: Self-pay | Admitting: Emergency Medicine

## 2016-01-19 DIAGNOSIS — R112 Nausea with vomiting, unspecified: Secondary | ICD-10-CM | POA: Diagnosis not present

## 2016-01-19 DIAGNOSIS — Z79899 Other long term (current) drug therapy: Secondary | ICD-10-CM | POA: Diagnosis not present

## 2016-01-19 DIAGNOSIS — J111 Influenza due to unidentified influenza virus with other respiratory manifestations: Secondary | ICD-10-CM | POA: Diagnosis not present

## 2016-01-19 DIAGNOSIS — Z87891 Personal history of nicotine dependence: Secondary | ICD-10-CM | POA: Insufficient documentation

## 2016-01-19 DIAGNOSIS — R509 Fever, unspecified: Secondary | ICD-10-CM | POA: Diagnosis present

## 2016-01-19 LAB — RAPID INFLUENZA A&B ANTIGENS
Influenza A (ARMC): NEGATIVE
Influenza B (ARMC): POSITIVE — AB

## 2016-01-19 MED ORDER — FLUTICASONE PROPIONATE 50 MCG/ACT NA SUSP: 2.0000 | Freq: Every day | NASAL | Status: DC

## 2016-01-19 MED ORDER — OSELTAMIVIR PHOSPHATE 75 MG PO CAPS: 75.0000 mg | ORAL_CAPSULE | Freq: Two times a day (BID) | ORAL | Status: DC

## 2016-01-19 MED ORDER — PROMETHAZINE-DM 6.25-15 MG/5ML PO SYRP: 5.0000 mL | ORAL_SOLUTION | Freq: Four times a day (QID) | ORAL | Status: DC | PRN

## 2016-01-19 NOTE — ED Notes (Signed)
Pt states only vomited once in last 24hrs and it was after a "coughing fit".

## 2016-01-19 NOTE — ED Provider Notes (Signed)
[facility] Emergency Department Provider Note  ____________________________________________  Time seen: Approximately 9:01 AM  I have reviewed the triage vital signs and the nursing notes.   HISTORY  Chief Complaint Fever    HPI Alexandra Olson is a 25 y.o. female , NAD, has had sudden onset fever, body aches, nasal congestion, nausea and vomiting since Thursday evening. Maximum temperature has been 101F. Notes she has significant postnasal drip in which she thinks is causing the nausea and vomiting. His pain or back pain. Has not had any headaches. Has not taken anything over-the-counter at this time. No known sick contacts.   Past Medical History  Diagnosis Date  . GERD (gastroesophageal reflux disease)     Patient Active Problem List   Diagnosis Date Noted  . Labor and delivery, indication for care 05/26/2015    Past Surgical History  Procedure Laterality Date  . No past surgeries      Current Outpatient Rx  Name  Route  Sig  Dispense  Refill  . chlorpheniramine-HYDROcodone (TUSSIONEX PENNKINETIC ER) 10-8 MG/5ML SUER   Oral   Take 5 mLs by mouth 2 (two) times daily.   140 mL   0   . fluticasone (FLONASE) 50 MCG/ACT nasal spray   Each Nare   Place 2 sprays into both nostrils daily.   16 g   0   . halobetasol (ULTRAVATE) 0.05 % cream   Topical   Apply topically 2 (two) times daily.   50 g   0   . ibuprofen (ADVIL,MOTRIN) 600 MG tablet   Oral   Take 1 tablet (600 mg total) by mouth every 6 (six) hours as needed.   60 tablet   0   . Lactase (LACTAID PO)   Oral   Take 2 tablets by mouth 3 (three) times daily with meals as needed (when eating dairy).         Marland Kitchen levonorgestrel (PLAN B 1-STEP) 1.5 MG tablet   Oral   Take 1 tablet (1.5 mg total) by mouth once.   1 tablet   2   . oseltamivir (TAMIFLU) 75 MG capsule   Oral   Take 1 capsule (75 mg total) by mouth 2 (two) times daily.   10 capsule   0   . Prenatal Vit-Fe  Fumarate-FA (PRENATAL MULTIVITAMIN) TABS tablet   Oral   Take 1 tablet by mouth daily.          . promethazine-dextromethorphan (PROMETHAZINE-DM) 6.25-15 MG/5ML syrup   Oral   Take 5 mLs by mouth 4 (four) times daily as needed for cough.   118 mL   0   . ranitidine (ZANTAC) 150 MG tablet   Oral   Take 150 mg by mouth 2 (two) times daily.           Allergies Zoloft  Family History  Problem Relation Age of Onset  . Diabetes Maternal Grandfather   . Diabetes Paternal Grandfather     Social History Social History  Substance Use Topics  . Smoking status: Former Games developer  . Smokeless tobacco: Never Used  . Alcohol Use: No     Review of Systems  Constitutional: Positive fever, fatigue. No chills or sweats. Eyes: No visual changes. No discharge, redness, swelling ENT: Positive nasal congestion, runny nose, postnasal drip. No sore throat or ear pain, sinus pressure. Cardiovascular: No chest pain. Respiratory: Positive cough. No shortness of breath. No wheezing.  Gastrointestinal: Positive nausea, vomiting. No abdominal pain.  No diarrhea.  Musculoskeletal: Positive general myalgias.  Skin: Negative for rash. Neurological: Negative for headaches, focal weakness or numbness. 10-point ROS otherwise negative.  ____________________________________________   PHYSICAL EXAM:  VITAL SIGNS: ED Triage Vitals  Enc Vitals Group     BP 01/19/16 0829 119/88 mmHg     Pulse Rate 01/19/16 0829 99     Resp 01/19/16 0829 18     Temp 01/19/16 0829 99.2 F (37.3 C)     Temp Source 01/19/16 0829 Oral     SpO2 01/19/16 0829 97 %     Weight 01/19/16 0829 155 lb (70.308 kg)     Height 01/19/16 0829 5\' 7"  (1.702 m)     Head Cir --      Peak Flow --      Pain Score 01/19/16 0832 3     Pain Loc --      Pain Edu? --      Excl. in GC? --     Constitutional: Alert and oriented. Well appearing and in no acute distress. Eyes: Conjunctivae are normal. PERRL. EOMI without pain.  Head:  Atraumatic. ENT:      Ears: TMs visualized bilaterally without erythema, bulging, effusion, perforation.      Nose: No congestion/rhinnorhea.      Mouth/Throat: Mucous membranes are moist. Clear postnasal drip. Pharynx without erythema, swelling, exudate Neck: No stridor. Supple with full range of motion Hematological/Lymphatic/Immunilogical: No cervical lymphadenopathy. Cardiovascular: Normal rate, regular rhythm. Normal S1 and S2.  Good peripheral circulation. Respiratory: Normal respiratory effort without tachypnea or retractions. Lungs CTAB. Neurologic:  Normal speech and language. No gross focal neurologic deficits are appreciated.  Skin:  Skin is warm, dry and intact. No rash noted. Psychiatric: Mood and affect are normal. Speech and behavior are normal. Patient exhibits appropriate insight and judgement.   ____________________________________________   LABS (all labs ordered are listed, but only abnormal results are displayed)  Labs Reviewed  RAPID INFLUENZA A&B ANTIGENS ([facility] ONLY)   ____________________________________________  EKG  None ____________________________________________  RADIOLOGY  None ____________________________________________    PROCEDURES  Procedure(s) performed: None    Medications - No data to display   ____________________________________________   INITIAL IMPRESSION / ASSESSMENT AND PLAN / ED COURSE  Pertinent lab results that were available during my care of the patient were reviewed by me and considered in my medical decision making (see chart for details).  Based on history and physical exam, patient's diagnosis is consistent with influenza. Patient will be discharged home with prescriptions for Tamiflu, Flonase, promethazine DM to take as directed. May take Tylenol as needed for fever or aches. [facility] or local primary care provider if symptoms persist past this treatment course. Patient  is given ED precautions to return to the ED for any worsening or new symptoms.    ____________________________________________  FINAL CLINICAL IMPRESSION(S) / ED DIAGNOSES  Final diagnoses:  Influenza      NEW MEDICATIONS STARTED DURING THIS VISIT:  New Prescriptions   FLUTICASONE (FLONASE) 50 MCG/ACT NASAL SPRAY    Place 2 sprays into both nostrils daily.   OSELTAMIVIR (TAMIFLU) 75 MG CAPSULE    Take 1 capsule (75 mg total) by mouth 2 (two) times daily.   PROMETHAZINE-DEXTROMETHORPHAN (PROMETHAZINE-DM) 6.25-15 MG/5ML SYRUP    Take 5 mLs by mouth 4 (four) times daily as needed for cough.         Hope PigeonJami L Raine Blodgett, PA-C 01/19/16 0935  Jene Everyobert Kinner, MD 01/19/16 1010

## 2016-01-19 NOTE — ED Notes (Signed)
Pt verbalized understanding of discharge instructions. NAD at this time. 

## 2016-01-19 NOTE — Discharge Instructions (Signed)

## 2016-01-19 NOTE — ED Notes (Signed)
Pt has had fever for 2-3 days (up to 101) with body aches and congestion.  Has also had cough.  Vomits but per pt only really vomits when gets in a coughing fit.  No respiratory distress noted in triage.

## 2016-04-21 ENCOUNTER — Ambulatory Visit
Admission: EM | Admit: 2016-04-21 | Discharge: 2016-04-21 | Disposition: A | Discharge status: Home / Self Care | Payer: Medicaid Other | Attending: Family Medicine | Admitting: Family Medicine

## 2016-04-21 DIAGNOSIS — J0100 Acute maxillary sinusitis, unspecified: Secondary | ICD-10-CM | POA: Diagnosis not present

## 2016-04-21 DIAGNOSIS — K0889 Other specified disorders of teeth and supporting structures: Secondary | ICD-10-CM | POA: Diagnosis not present

## 2016-04-21 MED ORDER — AMOXICILLIN-POT CLAVULANATE 875-125 MG PO TABS: 1.0000 | ORAL_TABLET | Freq: Two times a day (BID) | ORAL | Status: DC

## 2016-04-21 NOTE — ED Provider Notes (Signed)
CSN: [id number]     Arrival date & time 04/21/16  7829 History   First MD Initiated Contact with Patient 04/21/16 1007     Chief Complaint  Patient presents with  . Sinusitis   (Consider location/radiation/quality/duration/timing/severity/associated sxs/prior Treatment) HPI Comments: Patient presents with right sided maxillary and frontal sinus pain for about 1 month. Has had issues with right upper wisdom tooth extraction site since removal in Feb 2017. Occasional some drainage from socket. Has been worse in the past month.   Patient is a 25 y.o. female presenting with sinusitis. The history is provided by the patient.  Sinusitis Pain details:    Location:  Maxillary and frontal (right)   Quality:  Pressure   Duration:  1 month   Timing:  Intermittent Duration:  1 month Progression:  Worsening Chronicity:  New Relieved by: Ibuprofen. Worsened by:  Position changes Ineffective treatments:  Steroid sprays (Ibuprofen) Associated symptoms: ear pain (ear fullness -right), fatigue, headaches and tooth pain (right upper )   Associated symptoms: no chills, no congestion, no cough, no fever, no hoarse voice, no mouth breathing, no nausea, no rhinorrhea, no shortness of breath, no sneezing, no snoring, no sore throat, no swollen glands, no vertigo, no vomiting and no wheezing   Risk factors: smoke exposure     Past Medical History  Diagnosis Date  . GERD (gastroesophageal reflux disease)    Past Surgical History  Procedure Laterality Date  . Wisdom tooth extraction  12/2015   Family History  Problem Relation Age of Onset  . Diabetes Maternal Grandfather   . Diabetes Paternal Grandfather    Social History  Substance Use Topics  . Smoking status: Current Every Day Smoker -- 0.20 packs/day    Types: Cigarettes  . Smokeless tobacco: Never Used  . Alcohol Use: 0.0 oz/week    0 Standard drinks or equivalent per week     Comment: every once in a while   OB History    Gravida Para  Term Preterm AB TAB SAB Ectopic Multiple Living   0 0 0 0 0 0 1     Review of Systems  Constitutional: Positive for fatigue. Negative for fever and chills.  HENT: Positive for dental problem, ear pain (ear fullness -right), facial swelling and sinus pressure. Negative for congestion, hoarse voice, rhinorrhea, sneezing and sore throat.   Respiratory: Negative for snoring, cough, shortness of breath and wheezing.   Gastrointestinal: Negative for nausea and vomiting.  Neurological: Positive for headaches. Negative for vertigo.    Allergies  Zoloft  Home Medications   Prior to Admission medications   Medication Sig Start Date End Date Taking? Authorizing Provider  fluticasone (FLONASE) 50 MCG/ACT nasal spray Place 2 sprays into both nostrils daily. 01/19/16  Yes Jami L Hagler, PA-C  ibuprofen (ADVIL,MOTRIN) 600 MG tablet Take 1 tablet (600 mg total) by mouth every 6 (six) hours as needed. 05/28/15  Yes [place] Bing, MD  amoxicillin-clavulanate (AUGMENTIN) 875-125 MG tablet Take 1 tablet by mouth 2 (two) times daily. For 10 days. 04/21/16   Sudie Grumbling, NP   Meds Ordered and Administered this Visit  Medications - No data to display  BP 105/70 mmHg  Pulse 75  Temp(Src) 98 F (36.7 C) (Oral)  Resp 16  Ht  (1.702 m)  Wt 151 lb (68.493 kg)  BMI 23.64 kg/m2  SpO2 99%  LMP 04/19/2016 (Approximate) No data found.   Physical Exam  Constitutional: She is oriented to person,  place, and time. She appears well-developed and well-nourished.  HENT:  Head: Normocephalic and atraumatic.  Right Ear: External ear and ear canal normal. Tympanic membrane is bulging. A middle ear effusion is present.  Left Ear: Hearing, tympanic membrane, external ear and ear canal normal.  Nose: Sinus tenderness present. Right sinus exhibits maxillary sinus tenderness and frontal sinus tenderness. Left sinus exhibits no maxillary sinus tenderness and no frontal sinus tenderness.  Mouth/Throat:  Uvula is midline, oropharynx is clear and moist and mucous membranes are normal.    Right upper outer socket where wisdom tooth was extracted is inflamed with swelling and tenderness. No distinct drainage seen.   Neck: Normal range of motion. Neck supple.  Cardiovascular: Normal rate, regular rhythm, normal heart sounds and intact distal pulses.   Pulmonary/Chest: Effort normal and breath sounds normal. No respiratory distress. She has no wheezes. She has no rales.  Lymphadenopathy:    She has no cervical adenopathy.  Neurological: She is alert and oriented to person, place, and time.  Skin: Skin is warm and dry.    ED Course  Procedures (including critical care time)  Labs Review Labs Reviewed - No data to display  Imaging Review No results found.   Visual Acuity Review  Right Eye Distance:   Left Eye Distance:   Bilateral Distance:    Right Eye Near:   Left Eye Near:    Bilateral Near:         MDM   1. Acute maxillary sinusitis, recurrence not specified   2. Pain, dental    Recommend start Augmentin as directed. Restart Flonase as directed. Continue Ibuprofen up to 800mg  every 8 hours as needed for pain. Encouraged to d/c smoking.  Patient needs to follow-up with her Oral Surgeon for re-evaluation of wisdom tooth extraction site.     Sudie GrumblingAnn Berry Delainy Mcelhiney, NP 04/21/16 1049

## 2016-04-21 NOTE — Discharge Instructions (Signed)
Start Augmentin (antibiotic) as directed. Follow-up with Oral Surgeon for re-evaluation of post wisdom-tooth extraction within the next 1-2 weeks.  Sinusitis, Adult Sinusitis is redness, soreness, and inflammation of the paranasal sinuses. Paranasal sinuses are air pockets within the bones of your face. They are located beneath your eyes, in the middle of your forehead, and above your eyes. In healthy paranasal sinuses, mucus is able to drain out, and air is able to circulate through them by way of your nose. However, when your paranasal sinuses are inflamed, mucus and air can become trapped. This can allow bacteria and other germs to grow and cause infection. Sinusitis can develop quickly and last only a short time (acute) or continue over a long period (chronic). Sinusitis that lasts for more than 12 weeks is considered chronic. CAUSES Causes of sinusitis include:  Allergies.  Structural abnormalities, such as displacement of the cartilage that separates your nostrils (deviated septum), which can decrease the air flow through your nose and sinuses and affect sinus drainage.  Functional abnormalities, such as when the small hairs (cilia) that line your sinuses and help remove mucus do not work properly or are not present. SIGNS AND SYMPTOMS Symptoms of acute and chronic sinusitis are the same. The primary symptoms are pain and pressure around the affected sinuses. Other symptoms include:  Upper toothache.  Earache.  Headache.  Bad breath.  Decreased sense of smell and taste.  A cough, which worsens when you are lying flat.  Fatigue.  Fever.  Thick drainage from your nose, which often is green and may contain pus (purulent).  Swelling and warmth over the affected sinuses. DIAGNOSIS Your health care provider will perform a physical exam. During your exam, your health care provider may perform any of the following to help determine if you have acute sinusitis or chronic  sinusitis:  Look in your nose for signs of abnormal growths in your nostrils (nasal polyps).  Tap over the affected sinus to check for signs of infection.  View the inside of your sinuses using an imaging device that has a light attached (endoscope). If your health care provider suspects that you have chronic sinusitis, one or more of the following tests may be recommended:  Allergy tests.  Nasal culture. A sample of mucus is taken from your nose, sent to a lab, and screened for bacteria.  Nasal cytology. A sample of mucus is taken from your nose and examined by your health care provider to determine if your sinusitis is related to an allergy. TREATMENT Most cases of acute sinusitis are related to a viral infection and will resolve on their own within 10 days. Sometimes, medicines are prescribed to help relieve symptoms of both acute and chronic sinusitis. These may include pain medicines, decongestants, nasal steroid sprays, or saline sprays. However, for sinusitis related to a bacterial infection, your health care provider will prescribe antibiotic medicines. These are medicines that will help kill the bacteria causing the infection. Rarely, sinusitis is caused by a fungal infection. In these cases, your health care provider will prescribe antifungal medicine. For some cases of chronic sinusitis, surgery is needed. Generally, these are cases in which sinusitis recurs more than 3 times per year, despite other treatments. HOME CARE INSTRUCTIONS  Drink plenty of water. Water helps thin the mucus so your sinuses can drain more easily.  Use a humidifier.  Inhale steam 3-4 times a day (for example, sit in the bathroom with the shower running).  Apply a warm, moist washcloth to  your face 3-4 times a day, or as directed by your health care provider.  Use saline nasal sprays to help moisten and clean your sinuses.  Take medicines only as directed by your health care provider.  If you were  prescribed either an antibiotic or antifungal medicine, finish it all even if you start to feel better. SEEK IMMEDIATE MEDICAL CARE IF:  You have increasing pain or severe headaches.  You have nausea, vomiting, or drowsiness.  You have swelling around your face.  You have vision problems.  You have a stiff neck.  You have difficulty breathing.   This information is not intended to replace advice given to you by your health care provider. Make sure you discuss any questions you have with your health care provider.   Document Released: 10/20/2005 Document Revised: 11/10/2014 Document Reviewed: 11/04/2011 Elsevier Interactive Patient Education [employer]

## 2016-04-21 NOTE — ED Notes (Signed)
Patient complains of migraines, loss of hearing in right ear. Patient states that she had her wisdom teeth removed in February. Patient states that she had been having drainage from removal until 1 month ago. Patient states that symptoms started around 1 month ago.

## 2016-12-30 ENCOUNTER — Ambulatory Visit (INDEPENDENT_AMBULATORY_CARE_PROVIDER_SITE_OTHER): Payer: Medicaid Other | Admitting: Obstetrics and Gynecology

## 2016-12-30 ENCOUNTER — Encounter: Payer: Self-pay | Admitting: Obstetrics and Gynecology

## 2016-12-30 VITALS — BP 100/62 | HR 115 | Ht 67.0 in | Wt 159.0 lb

## 2016-12-30 DIAGNOSIS — R1031 Right lower quadrant pain: Secondary | ICD-10-CM

## 2016-12-30 DIAGNOSIS — M79622 Pain in left upper arm: Secondary | ICD-10-CM

## 2016-12-30 DIAGNOSIS — F411 Generalized anxiety disorder: Secondary | ICD-10-CM

## 2016-12-30 DIAGNOSIS — M7989 Other specified soft tissue disorders: Secondary | ICD-10-CM | POA: Diagnosis not present

## 2016-12-30 MED ORDER — NAPROXEN 500 MG PO TABS: 500.0000 mg | ORAL_TABLET | Freq: Two times a day (BID) | ORAL | 2 refills | Status: DC

## 2016-12-30 NOTE — Progress Notes (Signed)
Obstetrics & Gynecology Office Visit   Chief Complaint:  Chief Complaint  Patient presents with  . Abdominal Pain    [place] urgent care/possible right ovarian cyst    History of Present Illness: 26 year old G1P1001 presenting for follow up of possible ovarian cyst [facility].  Returned to work and after being at work for a week noted left axillary swelling that she attributed to an enlarged lymph node.  Pain 6-7/10 and constant throbbing pain.  Also noted abdominal pain.  Patient took antibiotics and prednisone taper at urgent care (amoxicillin finished 10 day course).  Right breast some tenderness, no other masses.  No fevers, chills.  Did have some associated mild upper respiratory symptoms.  No changes in deodorants or soaps.    Abdominal pain is more right sided, shoots down right groin.  8-9/10 when it happens, lasts 1 minute, sharp shooting in quality.  No changes in bowl movement, no nausea or emesis.    Feels moods have improved significantly.  Feels vistaril has helped on an as needed basis for anxiety symptoms.     Review of Systems: Review of Systems  Constitutional: Negative for chills and fever.  HENT: Positive for congestion. Negative for sore throat.   Respiratory: Negative for cough, shortness of breath and wheezing.   Cardiovascular: Positive for chest pain.  Gastrointestinal: Positive for abdominal pain. Negative for constipation, diarrhea, nausea and vomiting.  Genitourinary: Positive for dysuria. Negative for frequency and urgency.  Skin: Negative for rash.  Neurological: Negative for headaches.  Psychiatric/Behavioral: Negative for depression, substance abuse and suicidal ideas. The patient is nervous/anxious. The patient does not have insomnia.   10 point review of systems negative unless otherwise noted in HPI  Past Medical History:  Past Medical History:  Diagnosis Date  . GERD (gastroesophageal reflux disease)     Past Surgical History:    Past Surgical History:  Procedure Laterality Date  . WISDOM TOOTH EXTRACTION  12/2015    Gynecologic History: No LMP recorded. Patient is not currently having periods (Reason: IUD).  Obstetric History: G1P1001  Family History:  Family History  Problem Relation Age of Onset  . Diabetes Maternal Grandfather   . Diabetes Paternal Grandfather     Social History:  Social History   Social History  . Marital status: Single    Spouse name: N/A  . Number of children: N/A  . Years of education: N/A   Occupational History  . Not on file.   Social History Main Topics  . Smoking status: Current Every Day Smoker    Packs/day: 0.20    Types: Cigarettes  . Smokeless tobacco: Never Used  . Alcohol use 0.0 oz/week     Comment: every once in a while  . Drug use: No  . Sexual activity: Yes   Other Topics Concern  . Not on file   Social History Narrative  . No narrative on file    Allergies:  Allergies  Allergen Reactions  . Zoloft [Sertraline Hcl]     Extreme migraines    Medications: Prior to Admission medications   Medication Sig Start Date End Date Taking? Authorizing Provider  ibuprofen (ADVIL,MOTRIN) 600 MG tablet Take 1 tablet (600 mg total) by mouth every 6 (six) hours as needed. 05/28/15  Yes [place] Bingharlie Pickens, MD  levonorgestrel (LILETTA, 52 MG,) 18.6 MCG/DAY IUD IUD 1 each by Intrauterine route once.   Yes Vena AustriaAndreas Joana Nolton, MD  amoxicillin-clavulanate (AUGMENTIN) 875-125 MG tablet Take  1 tablet by mouth 2 (two) times daily. For 10 days. Patient not taking: Reported on 12/30/2016 04/21/16   Sudie Grumbling, NP  fluticasone [facility]) 50 MCG/ACT nasal spray Place 2 sprays into both nostrils daily. Patient not taking: Reported on 12/30/2016 01/19/16   Hope Pigeon, PA-C    Physical Exam Vitals:  Vitals:   12/30/16 1438  BP: 100/62  Pulse: (!) 115   No LMP recorded. Patient is not currently having periods (Reason: IUD).  General: NAD HEENT: normocephalic,  anicteric Thyroid: no enlargement, no palpable nodules Pulmonary: No increased work of breathing Cardiovascular: RRR, distal pulses 2+ Breast: .Breast symmetrical, tenderness over the pectoralis tendon as it enters the axilla, no palpable nodules or masses, no skin or nipple retraction present, no nipple discharge.  No axillary or supraclavicular lymphadenopathy. Abdomen: NABS, soft, non-tender, non-distended.  Umbilicus without lesions.  No hepatomegaly, splenomegaly or masses palpable. No evidence of hernia  Genitourinary:  External: Normal external female genitalia.  Normal urethral meatus, normal  Bartholin's and Skene's glands.    Vagina: Normal vaginal mucosa, no evidence of prolapse.    Cervix: Grossly normal in appearance, no bleeding, IUD strings visualized 2cm  Uterus: Non-enlarged, mobile, normal contour.  No CMT  Adnexa: ovaries non-enlarged, no adnexal masses  Rectal: deferred  Lymphatic: no evidence of inguinal lymphadenopathy Extremities: no edema, erythema, or tenderness Neurologic: Grossly intact Psychiatric: mood appropriate, affect full  Female chaperone present for pelvic and breast  portions of the physical exam  Assessment: 26 y.o. G1P1001 with left axillary pain and swelling, right lower quadrant abdominal pain  Plan: Problem List Items Addressed This Visit    None    Visit Diagnoses    RLQ abdominal pain    -  Primary   Left axillary pain       Left axillary swelling       Generalized anxiety disorder         1) RLQ pain - concern by urgent care for possible ovarian cyst.  Is on Liletta so potential for cyst formation is present.  Will obtain TVUS to evalute right adnexa  2) Left axillary pain swelling - Feel most likley secondary to tendonitis at location of pain is at the insertion pectoralis tendon.   Breast cancer exceedingly unlikely in a patient of her age.  Could also be fibrocystic changes or reactive lymph node that is resolving.  NSAID, tylenol,  warm compresses, sports bra.  Will re-eva lute at time of ultrasound follow up  3) Generalized anxiety - doing well on prn vistaril refill today

## 2017-01-19 ENCOUNTER — Ambulatory Visit (INDEPENDENT_AMBULATORY_CARE_PROVIDER_SITE_OTHER): Payer: Medicaid Other | Admitting: Obstetrics and Gynecology

## 2017-01-19 ENCOUNTER — Telehealth: Payer: Self-pay | Admitting: Obstetrics and Gynecology

## 2017-01-19 ENCOUNTER — Encounter: Payer: Self-pay | Admitting: Obstetrics and Gynecology

## 2017-01-19 ENCOUNTER — Ambulatory Visit (INDEPENDENT_AMBULATORY_CARE_PROVIDER_SITE_OTHER): Payer: Medicaid Other

## 2017-01-19 VITALS — BP 102/74 | HR 93 | Ht 67.0 in | Wt 156.0 lb

## 2017-01-19 DIAGNOSIS — R1031 Right lower quadrant pain: Secondary | ICD-10-CM

## 2017-01-19 DIAGNOSIS — N83201 Unspecified ovarian cyst, right side: Secondary | ICD-10-CM | POA: Diagnosis not present

## 2017-01-19 DIAGNOSIS — R2232 Localized swelling, mass and lump, left upper limb: Secondary | ICD-10-CM

## 2017-01-19 MED ORDER — HYDROXYZINE HCL 50 MG PO TABS: 50.0000 mg | ORAL_TABLET | Freq: Four times a day (QID) | ORAL | 3 refills | Status: DC | PRN

## 2017-01-19 NOTE — Progress Notes (Signed)
Gynecology Ultrasound Follow Up  Chief Complaint:  Chief Complaint  Patient presents with  . GYN U/S follow up     History of Present Illness: Patient is a 26 y.o. female who presents today for ultrasound evaluation of a possible ovarian cyst noted by PCP .  Ultrasound demonstrates the following findgins2229} Adnexa: no masses seen  Uterus: anteverted with endometrial stripe  2 mm, IUD in proper location Additional: no free fluid  Pelvic symptoms resolved  She continues to have left axillary pain and swelling which have become more prominent in the last few weeks.  Having significant pain when raising her arm.  No fevers, no chills.    Review of Systems: Review of Systems  Constitutional: Negative for chills and fever.  HENT: Negative for congestion.   Respiratory: Negative for cough and shortness of breath.   Cardiovascular: Negative for chest pain and palpitations.  Gastrointestinal: Negative for abdominal pain, constipation, diarrhea, heartburn, nausea and vomiting.  Genitourinary: Negative for dysuria, frequency and urgency.  Skin: Negative for itching and rash.  Neurological: Negative for dizziness and headaches.  Endo/Heme/Allergies: Negative for polydipsia.  Psychiatric/Behavioral: Negative for depression.    Past Medical History:  Past Medical History:  Diagnosis Date  . GERD (gastroesophageal reflux disease)     Past Surgical History:  Past Surgical History:  Procedure Laterality Date  . WISDOM TOOTH EXTRACTION  12/2015    Gynecologic History:  No LMP recorded. Patient is not currently having periods (Reason: IUD). Contraception: IUD   Family History:  Family History  Problem Relation Age of Onset  . Diabetes Maternal Grandfather   . Diabetes Paternal Grandfather     Social History:  Social History   Social History  . Marital status: Single    Spouse name: N/A  . Number of children: N/A  . Years of education: N/A   Occupational History    . Not on file.   Social History Main Topics  . Smoking status: Current Every Day Smoker    Packs/day: 0.20    Types: Cigarettes  . Smokeless tobacco: Never Used  . Alcohol use 0.0 oz/week     Comment: every once in a while  . Drug use: No  . Sexual activity: Yes   Other Topics Concern  . Not on file   Social History Narrative  . No narrative on file    Allergies:  Allergies  Allergen Reactions  . Zoloft [Sertraline Hcl] Other (See Comments)    Extreme migraines Extreme migraines    Medications: Prior to Admission medications   Medication Sig Start Date End Date Taking? Authorizing Provider  levonorgestrel (LILETTA, 52 MG,) 18.6 MCG/DAY IUD IUD 1 each by Intrauterine route once.   Yes Vena AustriaAndreas Damario Gillie, MD  naproxen (NAPROSYN) 500 MG tablet Take 1 tablet (500 mg total) by mouth 2 (two) times daily with a meal. As needed for pain 12/30/16  Yes Vena AustriaAndreas Harika Laidlaw, MD    Physical Exam Vitals: Blood pressure 102/74, pulse 93, height 5\' 7"  (1.702 m), weight 156 lb (70.8 kg), not currently breastfeeding.  General: NAD HEENT: normocephalic, anicteric Breast exam: Breast symmetrical, no tenderness, no palpable nodules or masses, no skin or nipple retraction present, no nipple discharge. Left axilla with small amount of swelling and tenderness overlying the area.  More prominent than last exam. Pulmonary: No increased work of breathing Extremities: no edema, erythema, or tenderness Neurologic: Grossly intact, normal gait Psychiatric: mood appropriate, affect full   Assessment: 26 y.o. G1P1001 No problem-specific  Assessment & Plan notes found for this encounter.   Plan: Problem List Items Addressed This Visit    None    Visit Diagnoses    Cyst of right ovary    -  Primary   Relevant Orders   Ambulatory referral to General Surgery   Axillary mass, left       Relevant Orders   Ambulatory referral to General Surgery      1) referral to general surgery for axillary  findings - do not suspect abscess possible reactive lymph node, accessory breast tissue  2) IUD in proper location no evidence of any ovarian cysts or masses on ultrasound today with symptoms resolved

## 2017-01-19 NOTE — Telephone Encounter (Signed)
Pt was seen today and is trying to find out who Dr. Bonney AidStaebler was refering her too. Her cell phone is not working the number to reach pt is [phone].Please advise.

## 2017-01-21 NOTE — Telephone Encounter (Signed)
[place], [facility]cp [facility] [phone]. Per Sheralyn Boatmanoni, the patient has not been seen there, so they cannot submit the referral. I attempted to contact the patient, but no answer.

## 2017-01-22 NOTE — Telephone Encounter (Signed)
Attempted to contact patient at the phone# listed below, the person who answered said she was not available.

## 2017-01-26 NOTE — Telephone Encounter (Signed)
I was able to reach the patient at the home #, and let her know that due to [place] [employer], the referral needed to be submitted or authorized by her pcp, but that [facility] cannot do that until they see her. Patient said she originally had an appointment w/ [facility], but that Dr Bonney AidStaebler was able to see her sooner which is why she came here, and is frustrated that now she has to wait for another appointment at [facility], but understands. I offered to call [facility] to set up the appointment and the patient agreed.

## 2017-01-26 NOTE — Telephone Encounter (Signed)
I spoke to [place] Oak[place]resa @ [facility] again, who confirmed they are on EPIC, and should be able to access Dr Ramiro HarvestStaebler's notes, and that I do not need to fax notes.

## 2017-01-26 NOTE — Telephone Encounter (Signed)
Attempted to contact the patient at the phone# listed below, the person who answered said Alexandra Olson was not available, then asked for the last name because she knows a lot of people named Alexandra Olson, then confirmed she does not know Alexandra Olson. She said the phone# is an Doctor, hospitalinternet #.

## 2017-01-26 NOTE — Telephone Encounter (Signed)
I spoke to [place] at [facility], who said the patient was scheduled at [facility] location on 01/[facility]/17 but cancelled. She called the [place] location and had the patient rescheduled for Thursday, 01/29/17 @ 3:00pm with a 2:30pm arrival at [address] Patient is to bring ID, Medicaid card, and if her address has changed from the one listed on the card, something with the new address. I spoke to the patient who is aware of appointment details listed above, and patient confirmed her address is still the same.

## 2017-02-19 ENCOUNTER — Other Ambulatory Visit: Payer: Self-pay | Admitting: Family Medicine

## 2017-02-24 ENCOUNTER — Other Ambulatory Visit: Payer: Self-pay | Admitting: Family Medicine

## 2017-02-24 DIAGNOSIS — R2232 Localized swelling, mass and lump, left upper limb: Secondary | ICD-10-CM

## 2017-03-03 ENCOUNTER — Ambulatory Visit
Admission: RE | Admit: 2017-03-03 | Discharge: 2017-03-03 | Disposition: A | Discharge status: Home / Self Care | Payer: Medicaid Other | Source: Ambulatory Visit | Attending: Family Medicine | Admitting: Family Medicine

## 2017-03-03 DIAGNOSIS — R2232 Localized swelling, mass and lump, left upper limb: Secondary | ICD-10-CM | POA: Insufficient documentation

## 2017-03-05 ENCOUNTER — Other Ambulatory Visit: Payer: Self-pay | Admitting: Family Medicine

## 2017-03-05 DIAGNOSIS — R2232 Localized swelling, mass and lump, left upper limb: Secondary | ICD-10-CM

## 2017-03-09 ENCOUNTER — Other Ambulatory Visit: Payer: Self-pay | Admitting: Family Medicine

## 2017-03-09 DIAGNOSIS — R928 Other abnormal and inconclusive findings on diagnostic imaging of breast: Secondary | ICD-10-CM

## 2017-03-09 DIAGNOSIS — R2232 Localized swelling, mass and lump, left upper limb: Secondary | ICD-10-CM

## 2017-03-17 ENCOUNTER — Ambulatory Visit
Admission: RE | Admit: 2017-03-17 | Discharge: 2017-03-17 | Disposition: A | Discharge status: Home / Self Care | Payer: Medicaid Other | Source: Ambulatory Visit | Attending: Family Medicine | Admitting: Family Medicine

## 2017-03-17 ENCOUNTER — Other Ambulatory Visit: Payer: Self-pay | Admitting: Family Medicine

## 2017-03-17 DIAGNOSIS — R928 Other abnormal and inconclusive findings on diagnostic imaging of breast: Secondary | ICD-10-CM | POA: Insufficient documentation

## 2017-03-17 DIAGNOSIS — R2232 Localized swelling, mass and lump, left upper limb: Secondary | ICD-10-CM

## 2017-03-17 DIAGNOSIS — N6032 Fibrosclerosis of left breast: Secondary | ICD-10-CM | POA: Insufficient documentation

## 2017-03-17 DIAGNOSIS — L02412 Cutaneous abscess of left axilla: Secondary | ICD-10-CM | POA: Insufficient documentation

## 2017-03-20 LAB — SURGICAL PATHOLOGY

## 2018-09-19 ENCOUNTER — Encounter: Payer: Self-pay | Admitting: Medical Oncology

## 2018-09-19 ENCOUNTER — Emergency Department: Payer: Medicaid Other

## 2018-09-19 ENCOUNTER — Emergency Department
Admission: EM | Admit: 2018-09-19 | Discharge: 2018-09-19 | Disposition: A | Discharge status: Home / Self Care | Payer: Medicaid Other | Attending: Emergency Medicine | Admitting: Emergency Medicine

## 2018-09-19 DIAGNOSIS — M25561 Pain in right knee: Secondary | ICD-10-CM | POA: Diagnosis not present

## 2018-09-19 DIAGNOSIS — F17210 Nicotine dependence, cigarettes, uncomplicated: Secondary | ICD-10-CM | POA: Diagnosis not present

## 2018-09-19 MED ORDER — KETOROLAC TROMETHAMINE 30 MG/ML IJ SOLN
30.0000 mg | Freq: Once | INTRAMUSCULAR | Status: AC
  Administered 2018-09-19: 30 mg via INTRAMUSCULAR
  Filled 2018-09-19: qty 1

## 2018-09-19 MED ORDER — KETOROLAC TROMETHAMINE 10 MG PO TABS: 10.0000 mg | ORAL_TABLET | Freq: Four times a day (QID) | ORAL | 0 refills | Status: DC | PRN

## 2018-09-19 NOTE — ED Provider Notes (Signed)
[facility] Emergency Department Provider Note  ____________________________________________  Time seen: Approximately 2:59 PM  I have reviewed the triage vital signs and the nursing notes.   HISTORY  Chief Complaint Knee Pain    HPI Alexandra Olson is a 27 y.o. female that presents emergency department for evaluation of right knee pain for 1 week.  Patient states that the knee started hurting when she was at work.  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  Pain started after her work shift.  She states that she then plays video games all week and sits with her knees crossed a lot during the day.   She has been walking but with pain.  Pain is primarily to the front of her knee.  She states that the pain feels like it is under her kneecap.  No calf pain, numbness, tingling.   Past Medical History:  Diagnosis Date  . GERD (gastroesophageal reflux disease)     There are no active problems to display for this patient.   Past Surgical History:  Procedure Laterality Date  . WISDOM TOOTH EXTRACTION  12/2015    Prior to Admission medications   Medication Sig Start Date End Date Taking? Authorizing Provider  hydrOXYzine (ATARAX/VISTARIL) 50 MG tablet Take 1 tablet (50 mg total) by mouth every 6 (six) hours as needed for itching. 01/19/17   Vena AustriaStaebler, Andreas, MD  ketorolac (TORADOL) 10 MG tablet Take 1 tablet (10 mg total) by mouth every 6 (six) hours as needed. 09/19/18   Enid DerryWagner, Arlicia Paquette, PA-C  levonorgestrel (LILETTA, 52 MG,) 18.6 MCG/DAY IUD IUD 1 each by Intrauterine route once.    Vena AustriaStaebler, Andreas, MD  naproxen (NAPROSYN) 500 MG tablet Take 1 tablet (500 mg total) by mouth 2 (two) times daily with a meal. As needed for pain 12/30/16   Vena AustriaStaebler, Andreas, MD    Allergies Zoloft [sertraline hcl]  Family History  Problem Relation Age of Onset  . Diabetes Maternal Grandfather   . Diabetes Paternal Grandfather     Social  History Social History   Tobacco Use  . Smoking status: Current Every Day Smoker    Packs/day: 0.20    Types: Cigarettes  . Smokeless tobacco: Never Used  Substance Use Topics  . Alcohol use: Yes    Alcohol/week: 0.0 standard drinks    Comment: every once in a while  . Drug use: No    Frequency: 1.0 times per week    Types: Marijuana     Review of Systems  Constitutional: No fever/chills Gastrointestinal: No nausea, no vomiting.  Musculoskeletal: Positive for knee pain.  Skin: Negative for rash, abrasions, lacerations, ecchymosis. Neurological: Negative for  numbness or tingling   ____________________________________________   PHYSICAL EXAM:  VITAL SIGNS: ED Triage Vitals  Enc Vitals Group     BP 09/19/18 1125 129/72     Pulse Rate 09/19/18 1125 81     Resp 09/19/18 1125 18     Temp 09/19/18 1125 98.2 F (36.8 C)     Temp Source 09/19/18 1125 Oral     SpO2 09/19/18 1125 98 %     Weight 09/19/18 1115 160 lb (72.6 kg)     Height 09/19/18 1115 5\' 7"  (1.702 m)     Head Circumference --      Peak Flow --      Pain Score 09/19/18 1115 7     Pain Loc --      Pain Edu? --  Excl. in GC? --      Constitutional: Alert and oriented. Well appearing and in no acute distress. Eyes: Conjunctivae are normal. PERRL. EOMI. Head: Atraumatic. ENT:      Ears:      Nose: No congestion/rhinnorhea.      Mouth/Throat: Mucous membranes are moist.  Neck: No stridor.   Cardiovascular: Normal rate, regular rhythm.  Good peripheral circulation. Respiratory: Normal respiratory effort without tachypnea or retractions. Lungs CTAB. Good air entry to the bases with no decreased or absent breath sounds. Musculoskeletal: Full range of motion to all extremities. No gross deformities appreciated. No tenderness to palpation. No effusion noted. Negative anterior drawer, posterior drawer, valgus, varus, mcMurray, patella apprehension, apley grind. Neurologic:  Normal speech and language. No  gross focal neurologic deficits are appreciated.  Skin:  Skin is warm, dry and intact. No rash noted. Psychiatric: Mood and affect are normal. Speech and behavior are normal. Patient exhibits appropriate insight and judgement.   ____________________________________________   LABS (all labs ordered are listed, but only abnormal results are displayed)  Labs Reviewed - No data to display ____________________________________________  EKG   ____________________________________________  RADIOLOGY Lexine Baton, personally viewed and evaluated these images (plain radiographs) as part of my medical decision making, as well as reviewing the written report by the radiologist.  Dg Knee Complete 4 Views Right  Result Date: 09/19/2018 CLINICAL DATA:  Diffuse right knee pain.  Swelling. EXAM: RIGHT KNEE - COMPLETE 4+ VIEW COMPARISON:  12/18/2007 FINDINGS: No evidence of fracture, dislocation, or joint effusion. No evidence of arthropathy or other focal bone abnormality. There may be soft tissue swelling around the right knee. IMPRESSION: No acute bone abnormality to the right knee. Electronically Signed   By: Richarda Overlie M.D.   On: 09/19/2018 12:19    ____________________________________________    PROCEDURES  Procedure(s) performed:    Procedures    Medications  ketorolac (TORADOL) 30 MG/ML injection 30 mg (30 mg Intramuscular Given 09/19/18 1325)     ____________________________________________   INITIAL IMPRESSION / ASSESSMENT AND PLAN / ED COURSE  Pertinent labs & imaging results that were available during my care of the patient were reviewed by me and considered in my medical decision making (see chart for details).  Review of the [place] CSRS was performed in accordance of the [facility] prior to dispensing any controlled drugs.     Patient presented to the emergency department for evaluation of right knee pain for 1 week.  Vital signs and exam are reassuring.  X-ray negative  for acute bony abnormalities.  Exam is largely unremarkable.  Knee was ace wrapped.  Crutches were given.  Patient will be discharged home with prescriptions for Toradol. Patient is to follow up with primary care as directed. Patient is given ED precautions to return to the ED for any worsening or new symptoms.     ____________________________________________  FINAL CLINICAL IMPRESSION(S) / ED DIAGNOSES  Final diagnoses:  Acute pain of right knee      NEW MEDICATIONS STARTED DURING THIS VISIT:  ED Discharge Orders         Ordered    ketorolac (TORADOL) 10 MG tablet  Every 6 hours PRN     09/19/18 1323              This chart was dictated using voice recognition software/Dragon. Despite best efforts to proofread, errors can occur which can change the meaning. Any change was purely unintentional.    Enid Derry, PA-C 09/19/18 1550  Myrna Blazer, MD 09/19/18 [phone]

## 2018-09-19 NOTE — ED Triage Notes (Addendum)
Pt reports that she has been having increased pain and swelling to rt knee over past week. Denies injury.

## 2019-09-09 ENCOUNTER — Ambulatory Visit: Payer: Medicaid Other | Admitting: Obstetrics and Gynecology

## 2019-09-19 ENCOUNTER — Encounter: Payer: Self-pay | Admitting: Obstetrics and Gynecology

## 2019-09-19 ENCOUNTER — Other Ambulatory Visit (HOSPITAL_COMMUNITY)
Admission: RE | Admit: 2019-09-19 | Discharge: 2019-09-19 | Disposition: A | Discharge status: Home / Self Care | Payer: Medicaid Other | Source: Ambulatory Visit | Attending: Obstetrics and Gynecology | Admitting: Obstetrics and Gynecology

## 2019-09-19 ENCOUNTER — Ambulatory Visit (INDEPENDENT_AMBULATORY_CARE_PROVIDER_SITE_OTHER): Payer: Medicaid Other | Admitting: Obstetrics and Gynecology

## 2019-09-19 ENCOUNTER — Other Ambulatory Visit: Payer: Self-pay

## 2019-09-19 VITALS — BP 110/60 | Ht 67.0 in | Wt 161.0 lb

## 2019-09-19 DIAGNOSIS — Z30432 Encounter for removal of intrauterine contraceptive device: Secondary | ICD-10-CM

## 2019-09-19 DIAGNOSIS — N898 Other specified noninflammatory disorders of vagina: Secondary | ICD-10-CM

## 2019-09-19 DIAGNOSIS — Z124 Encounter for screening for malignant neoplasm of cervix: Secondary | ICD-10-CM | POA: Diagnosis present

## 2019-09-19 DIAGNOSIS — N760 Acute vaginitis: Secondary | ICD-10-CM

## 2019-09-19 NOTE — Progress Notes (Signed)
   History of Present Illness:  Alexandra Olson is a 28 y.o. that had a Mirena IUD placed approximately 4 years ago. Since that time, she states that she was happy with the IUD but would now like to have it removed. Plan to start trying to conceive soon.  The following portions of the patient's history were reviewed and updated as appropriate: allergies, current medications, past family history, past medical history, past social history, past surgical history and problem list.  There are no active problems to display for this patient.  Medications:  Current Outpatient Medications on File Prior to Visit  Medication Sig Dispense Refill  . hydrOXYzine (ATARAX/VISTARIL) 50 MG tablet Take 1 tablet (50 mg total) by mouth every 6 (six) hours as needed for itching. 60 tablet 3  . ketorolac (TORADOL) 10 MG tablet Take 1 tablet (10 mg total) by mouth every 6 (six) hours as needed. (Patient not taking: Reported on 09/19/2019) 20 tablet 0  . naproxen (NAPROSYN) 500 MG tablet Take 1 tablet (500 mg total) by mouth 2 (two) times daily with a meal. As needed for pain (Patient not taking: Reported on 09/19/2019) 60 tablet 2  . ranitidine (ZANTAC) 150 MG capsule Take by mouth.     No current facility-administered medications on file prior to visit.    Allergies: is allergic to zoloft [sertraline hcl].  Physical Exam:  BP 110/60   Ht 5\' 7"  (1.702 m)   Wt 161 lb (73 kg)   BMI 25.22 kg/m  Body mass index is 25.22 kg/m. Constitutional: Well nourished, well developed female in no acute distress.  Abdomen: diffusely non tender to palpation, non distended, and no masses, hernias Neuro: Grossly intact Psych:  Normal mood and affect.    Pelvic exam:  Two IUD strings present seen coming from the cervical os. EGBUS, vaginal vault and cervix: within normal limits  IUD Removal Strings of IUD identified and grasped.  IUD removed without problem.  Pt tolerated this well.  IUD noted to be intact.        Assessment/Plan:    Assessment: IUD Removal  Plan: IUD removed and plan for contraception is no method. Given information on pregnancy nutrition Nuswab for possible yeast infection Pap smear collected She was amenable to this plan.  Adrian Prows MD [facility] 09/19/2019 4:32 PM

## 2019-09-21 LAB — CYTOLOGY - PAP: Diagnosis: NEGATIVE

## 2019-09-22 ENCOUNTER — Telehealth: Payer: Self-pay

## 2019-09-22 NOTE — Telephone Encounter (Signed)
Is this normal at all? Please advise in CRS absence

## 2019-09-22 NOTE — Telephone Encounter (Signed)
Called pt to discuss migraine and how it could be related to the shift in hormone levels since removing the IUD. Pt aware it could take a couple more days for her body to adjust, I have advised pt to alternate between tylenol and Ibuprofen and not just Ibuprofen which itself can cause a rebound headache if taken to much pt is understanding and will give [place] a call if things get worse or if they do not get better in a couple days.

## 2019-09-22 NOTE — Telephone Encounter (Signed)
This isn't something that is common, but removal of the Mirena will cause a decrease in progesterone, so I imagine that it could be the cause of the migraine.

## 2019-09-22 NOTE — Telephone Encounter (Signed)
Pt calling; IUD was taken out Monday; for the last two days she has had a major migraine.  Is this normal?  She is struggling.  [phone]

## 2019-09-24 LAB — NUSWAB VAGINITIS PLUS (VG+)
Candida albicans, NAA: POSITIVE — AB
Candida glabrata, NAA: NEGATIVE
Chlamydia trachomatis, NAA: NEGATIVE
Neisseria gonorrhoeae, NAA: NEGATIVE
Trich vag by NAA: NEGATIVE

## 2019-09-26 ENCOUNTER — Other Ambulatory Visit: Payer: Self-pay | Admitting: Obstetrics and Gynecology

## 2019-09-26 DIAGNOSIS — B373 Candidiasis of vulva and vagina: Secondary | ICD-10-CM

## 2019-09-26 DIAGNOSIS — B3731 Acute candidiasis of vulva and vagina: Secondary | ICD-10-CM

## 2019-09-26 MED ORDER — FLUCONAZOLE 150 MG PO TABS: 150.0000 mg | ORAL_TABLET | ORAL | 0 refills | Status: AC

## 2019-09-26 NOTE — Progress Notes (Signed)
Called and discussed with patient- rx sent for diflucan

## 2020-02-16 ENCOUNTER — Telehealth: Payer: Medicaid Other | Admitting: Physician Assistant

## 2020-02-16 DIAGNOSIS — Z20822 Contact with and (suspected) exposure to covid-19: Secondary | ICD-10-CM

## 2020-02-16 MED ORDER — NAPROXEN 500 MG PO TABS: 500.0000 mg | ORAL_TABLET | Freq: Two times a day (BID) | ORAL | 0 refills | Status: DC

## 2020-02-16 MED ORDER — ALBUTEROL SULFATE HFA 108 (90 BASE) MCG/ACT IN AERS: 2.0000 | INHALATION_SPRAY | RESPIRATORY_TRACT | 0 refills | Status: DC | PRN

## 2020-02-16 MED ORDER — PROMETHAZINE-DM 6.25-15 MG/5ML PO SYRP: 5.0000 mL | ORAL_SOLUTION | Freq: Four times a day (QID) | ORAL | 0 refills | Status: DC | PRN

## 2020-02-16 NOTE — Progress Notes (Signed)
E-Visit for [employer] Virus Screening  Your current symptoms could be consistent with the coronavirus or other upper respiratory infection.  We will treat the symptoms the same way, but getting testing will be helpful.  Many health care providers can now test patients at their office but not all are.  [facility] has multiple testing sites. For information on our COVID testing locations and hours go to [email or web address]  We are enrolling you in our MyChart Home Monitoring for COVID19 . Daily you will receive a questionnaire within the MyChart website. Our COVID 19 response team will be monitoring your responses daily.  Testing Information: The COVID-19 Community Testing sites will begin testing BY APPOINTMENT ONLY.  You can schedule online at [email or web address]  If you do not have access to a smart phone or computer you may call [phone] for an appointment.   Additional testing sites in the Community:  . For [facility] Testing sites in [facility]  [email or web address]  . For Pop-up testing sites in [place]  [email or web address]  . For Testing sites with regular hours [email or web address]  . For [facility] [email or web address]  . For [facility] [email or web address]  . For [facility] testing in [place] and [employer] [email or web address]  . For [facility] testing in [facility]   [email or web address]  For  more information about community testing call [phone]   Please quarantine yourself while awaiting your test results. Please stay home for a minimum of 10 days from the first day of  illness with improving symptoms and you have had 24 hours of no fever (without the use of Tylenol (Acetaminophen) Motrin (Ibuprofen) or any fever reducing medication).  Also - Do not get tested prior to returning to work because once you have had a positive test the test can stay positive for more then a month in some cases.   You should wear a mask or cloth face covering over your nose and mouth if you must be around other people or animals, including pets (even at home). Try to stay at least 6 feet away from other people. This will protect the people around you.  Please continue good preventive care measures, including:  frequent hand-washing, avoid touching your face, cover coughs/sneezes, stay out of crowds and keep a 6 foot distance from others.  COVID-19 is a respiratory illness with symptoms that are similar to the flu. Symptoms are typically mild to moderate, but there have been cases of severe illness and death due to the virus.   The following symptoms may appear 2-14 days after exposure: . Fever . Cough . Shortness of breath or difficulty breathing . Chills . Repeated shaking with chills . Muscle pain . Headache . Sore throat . New loss of taste or smell . Fatigue . Congestion or runny nose . Nausea or vomiting . Diarrhea  Go to the nearest hospital ED for assessment if fever/cough/breathlessness are severe or illness seems like a threat to life.  It is vitally important that if you feel that you have an infection such as this virus or any other virus that you stay home and away from places where you may spread it to others.  You should avoid contact with people age 10 and older.   You can use medication such as A prescription inhaler called Albuterol MDI 90 mcg /actuation 2 puffs every 4 hours as needed for shortness of breath, wheezing, cough, A prescription cough medication called Phenergan DM 6.25 mg/15 mg. You make take one teaspoon /  5 ml every 4-6 hours as needed for cough and  I have prescribed Naprosyn 500 mg. Take twice daily as needed for fever or body aches for 2 weeks  You may also take acetaminophen (Tylenol) as needed for fever.  Reduce your risk of any infection by using the same precautions used for avoiding the common cold or flu:  Marland Kitchen Wash your hands often with soap and warm water for at least 20 seconds.  If soap and water are not readily available, use an alcohol-based hand sanitizer with at least 60% alcohol.  . If coughing or sneezing, cover your mouth and nose by coughing or sneezing into the elbow areas of your shirt or coat, into a tissue or into your sleeve (not your hands). . Avoid shaking hands with others and consider head nods or verbal greetings only. . Avoid touching your eyes, nose, or mouth with unwashed hands.  . Avoid close contact with people who are sick. . Avoid places or events with large numbers of people in one location, like concerts or sporting events. . Carefully consider travel plans you have or are making. . If you are planning any travel outside or inside the [place], visit the CDC's Travelers' Health webpage for the latest health notices. . If you have some symptoms but not all symptoms, continue to monitor at home and seek medical attention if your symptoms worsen. . If you are having a medical emergency, call 911.  HOME CARE . Only take medications as instructed by your medical team. . Drink plenty of fluids and get plenty of rest. . A steam or ultrasonic humidifier can help if you have congestion.   GET HELP RIGHT AWAY IF YOU HAVE EMERGENCY WARNING SIGNS** FOR COVID-19. If you or someone is showing any of these signs seek emergency medical care immediately. Call 911 or proceed to your closest emergency facility if: . You develop worsening high fever. . Trouble breathing . Bluish lips or face . Persistent pain or pressure in the chest . New confusion . Inability to wake or stay awake . You cough up blood. . Your symptoms  become more severe  **This list is not all possible symptoms. Contact your medical provider for any symptoms that are sever or concerning to you.  MAKE SURE YOU   Understand these instructions.  Will watch your condition.  Will get help right away if you are not doing well or get worse.  Your e-visit answers were reviewed by a board certified advanced clinical practitioner to complete your personal care plan.  Depending on the condition, your plan could have included both over the counter or prescription medications.  If there is a problem please reply once you have received a response from your provider.  Your safety is important to [place].  If you have drug allergies check your prescription carefully.    You can use MyChart to ask questions about today's visit, request a non-urgent call back, or ask for a work or school excuse for 24 hours related to this e-Visit. If it has been greater than 24 hours you will need to follow up with your provider, or enter a new e-Visit to address those concerns. You will get an e-mail in the next two days asking about your experience.  I hope that your e-visit has been valuable and will speed your recovery. Thank you for using e-visits.   Greater than 5 minutes, yet less than 10 minutes of time have been spent researching, coordinating, and implementing  care for this patient today

## 2021-07-25 ENCOUNTER — Other Ambulatory Visit: Payer: Self-pay | Admitting: Primary Care

## 2021-07-25 ENCOUNTER — Other Ambulatory Visit (HOSPITAL_COMMUNITY): Payer: Self-pay | Admitting: Primary Care

## 2021-07-25 DIAGNOSIS — R1013 Epigastric pain: Secondary | ICD-10-CM

## 2021-07-26 ENCOUNTER — Ambulatory Visit: Admission: RE | Admit: 2021-07-26 | Payer: Medicaid Other | Source: Ambulatory Visit

## 2021-11-03 NOTE — L&D Delivery Note (Signed)
     Delivery Note   [name] is a 31 y.o. G2P1001 at [redacted]w[redacted]d Estimated Date of Delivery: 08/30/22  PRE-OPERATIVE DIAGNOSIS:  1) [redacted]w[redacted]d pregnancy. Marginal cord insertion   POST-OPERATIVE DIAGNOSIS:  1) [redacted]w[redacted]d pregnancy s/p Vaginal, Spontaneous  Marginal cord insertion  Delivery Type: Vaginal, Spontaneous    Delivery Anesthesia: Epidural   Labor Complications:  none    ESTIMATED BLOOD LOSS: 150 ml    FINDINGS:   1) female infant, Apgar scores of 9   at 1 minute and 9   at 5 minutes and a birthweight of   ounces.    2) Nuchal cord: no  SPECIMENS:   PLACENTA:   Appearance: Intact , 3 vessel cord   Removal: Spontaneous      Disposition:  to pathology , marginal cord insertion  DISPOSITION:  Infant to left in stable condition in the delivery room, with L&D personnel and mother,  NARRATIVE SUMMARY: Labor course:  Ms. Alexandra Olson is a G2P1001 at [redacted]w[redacted]d who presented for labor management.  She progressed well in labor with pitocin.  She received the appropriate epidural anesthesia and proceeded to complete dilation. She evidenced good maternal expulsive effort during the second stage. She went on to deliver a viable female infant "IVY". The placenta delivered without problems and was noted to be complete. A perineal and vaginal examination was performed. Episiotomy/Lacerations:   Right labial abrasion, hemostatic not in need of repair. The patient tolerated this well.   Philip Aspen, CNM  08/24/2022 4:04 AM

## 2022-01-13 ENCOUNTER — Ambulatory Visit (INDEPENDENT_AMBULATORY_CARE_PROVIDER_SITE_OTHER): Payer: Medicaid Other | Admitting: Obstetrics

## 2022-01-13 ENCOUNTER — Other Ambulatory Visit: Payer: Self-pay | Admitting: Obstetrics

## 2022-01-13 ENCOUNTER — Other Ambulatory Visit (HOSPITAL_COMMUNITY)
Admission: RE | Admit: 2022-01-13 | Discharge: 2022-01-13 | Disposition: A | Discharge status: Home / Self Care | Payer: Medicaid Other | Source: Ambulatory Visit | Attending: Obstetrics | Admitting: Obstetrics

## 2022-01-13 ENCOUNTER — Encounter: Payer: Self-pay | Admitting: Obstetrics

## 2022-01-13 ENCOUNTER — Other Ambulatory Visit: Payer: Self-pay

## 2022-01-13 VITALS — BP 122/70 | Wt 200.0 lb

## 2022-01-13 DIAGNOSIS — Z3480 Encounter for supervision of other normal pregnancy, unspecified trimester: Secondary | ICD-10-CM | POA: Diagnosis present

## 2022-01-13 DIAGNOSIS — Z113 Encounter for screening for infections with a predominantly sexual mode of transmission: Secondary | ICD-10-CM | POA: Diagnosis present

## 2022-01-13 DIAGNOSIS — Z124 Encounter for screening for malignant neoplasm of cervix: Secondary | ICD-10-CM | POA: Insufficient documentation

## 2022-01-13 DIAGNOSIS — O219 Vomiting of pregnancy, unspecified: Secondary | ICD-10-CM

## 2022-01-13 DIAGNOSIS — O0990 Supervision of high risk pregnancy, unspecified, unspecified trimester: Secondary | ICD-10-CM | POA: Insufficient documentation

## 2022-01-13 DIAGNOSIS — Z3A08 8 weeks gestation of pregnancy: Secondary | ICD-10-CM

## 2022-01-13 MED ORDER — ONDANSETRON 4 MG PO TBDP: 4.0000 mg | ORAL_TABLET | Freq: Four times a day (QID) | ORAL | 0 refills | Status: DC | PRN

## 2022-01-13 MED ORDER — CITRANATAL ASSURE 35-1 & 300 MG PO MISC: 2.0000 | Freq: Every day | ORAL | 11 refills | Status: DC

## 2022-01-13 NOTE — Progress Notes (Signed)
NOB today. LMP 11/16/2021, needs RX for medicaid covered PNV ?

## 2022-01-13 NOTE — Progress Notes (Signed)
? ? ? ? ? ?New Obstetric Patient H&P  ? ? ?Chief Complaint: "Desires prenatal care" ? ? ?History of Present Illness: Patient is a 31 y.o. G2P1001 Not Hispanic or Latino female, LMP 11/16/2021 presents with amenorrhea and positive home pregnancy test. Based on her  LMP, her EDD is Estimated Date of Delivery: 08/23/22 and her EGA is 10821w2d. Cycles are 6. days, regular, and occur approximately every : 28 days. Her last pap smear was 2 years ago and was 2no abnormalities.  ?  ?She had a urine pregnancy test which was positive about 2  week(s)  ago. Her last menstrual period was normal and lasted for  5 or 6 day(s). Since her LMP she claims she has experienced nausea. She denies vaginal bleeding. Her past medical history is noncontributory. Her prior pregnancies are notable for none ? ?Since her LMP, she admits to the use of tobacco products  no ?She claims she has gained   no pounds since the start of her pregnancy.  ?There are cats in the home in the home  yes If yes Indoor ?She admits close contact with children on a regular basis  yes  ?She has had chicken pox in the past yes ?She has had Tuberculosis exposures, symptoms, or previously tested positive for TB   no ?Current or past history of domestic violence. no ? ?Genetic Screening/Teratology Counseling: (Includes patient, baby's father, or anyone in either family with:)  ? ?1. [facility]= [facility]  no ?2. Thalassemia ([place], [place], Mediterranean, or Asian background): MCV<80  no ?3. Neural tube defect (meningomyelocele, spina bifida, anencephaly)  no ?4. Congenital heart defect  no  ?5. Down syndrome  no ?6. Tay-Sachs (Jewish, [place])  no ?7. Canavan's Disease  no ?8. Sickle cell disease or trait (African)  no  ?9. Hemophilia or other blood disorders  no  ?10. Muscular dystrophy  no  ?11. Cystic fibrosis  no  ?12. Huntington's Chorea  no  ?13. Mental retardation/autism  no ?14. Other inherited genetic or chromosomal disorder  no ?15. Maternal  metabolic disorder (DM, PKU, etc)  no ?16. Patient or FOB with a child with a birth defect not listed above no  ?16a. Patient or FOB with a birth defect themselves no ?17. Recurrent pregnancy loss, or stillbirth  no  ?18. Any medications since LMP other than prenatal vitamins (include vitamins, supplements, OTC meds, drugs, alcohol)  no ?19. Any other genetic/environmental exposure to discuss  no ? ?Infection History:  ? ?1. Lives with someone with TB or TB exposed  no  ?2. Patient or partner has history of genital herpes  no ?3. Rash or viral illness since LMP  no ?4. History of STI (GC, CT, HPV, syphilis, HIV)  no ?5. History of recent travel :  no ? ?Other pertinent information:  no  ? ? ? ?Review of Systems:10 point review of systems negative unless otherwise noted in HPI ? ?Past Medical History:  ?Past Medical History:  ?Diagnosis Date  ? GERD (gastroesophageal reflux disease)   ? ? ?Past Surgical History:  ?Past Surgical History:  ?Procedure Laterality Date  ? WISDOM TOOTH EXTRACTION  12/2015  ? ? ?Gynecologic History: Patient's last menstrual period was 11/16/2021 (exact date). ? ?Obstetric History: G2P1001 ? ?Family History:  ?Family History  ?Problem Relation Age of Onset  ? Diabetes Maternal Grandfather   ? Diabetes Paternal Grandfather   ? ? ?Social History:  ?Social History  ? ?Socioeconomic History  ? Marital  status: Married  ?  Spouse name: Not on file  ? Number of children: Not on file  ? Years of education: Not on file  ? Highest education level: Not on file  ?Occupational History  ? Not on file  ?Tobacco Use  ? Smoking status: Former  ?  Packs/day: 0.20  ?  Types: Cigarettes  ? Smokeless tobacco: Never  ?Vaping Use  ? Vaping Use: Former  ?Substance and Sexual Activity  ? Alcohol use: Yes  ?  Alcohol/week: 0.0 standard drinks  ?  Comment: every once in a while  ? Drug use: No  ?  Frequency: 1.0 times per week  ?  Types: Marijuana  ? Sexual activity: Yes  ?Other Topics Concern  ? Not on file  ?Social  History Narrative  ? Not on file  ? ?Social Determinants of Health  ? ?Financial Resource Strain: Not on file  ?Food Insecurity: Not on file  ?Transportation Needs: Not on file  ?Physical Activity: Not on file  ?Stress: Not on file  ?Social Connections: Not on file  ?Intimate Partner Violence: Not on file  ? ? ?Allergies:  ?Allergies  ?Allergen Reactions  ? Zoloft [Sertraline Hcl] Other (See Comments)  ?  Extreme migraines ?Extreme migraines  ? ? ?Medications: ?Prior to Admission medications   ?Medication Sig Start Date End Date Taking? Authorizing Provider  ?albuterol (VENTOLIN HFA) 108 (90 Base) MCG/ACT inhaler Inhale 2 puffs into the lungs every 2 (two) hours as needed for wheezing or shortness of breath (cough). 02/16/20  Yes Muthersbaugh, Dahlia Client, PA-C  ?Prenat w/o A-FeCbGl-DSS-FA-DHA (CITRANATAL ASSURE) 35-1 & 300 MG tablet Take 2 tablets by mouth daily. 01/13/22  Yes Mirna Mires, CNM  ?Prenatal Vit-Fe Fumarate-FA (PRENATAL VITAMIN PO) Take by mouth.   Yes [provider]  ?naproxen (NAPROSYN) 500 MG tablet Take 1 tablet (500 mg total) by mouth 2 (two) times daily with a meal. ?Patient not taking: Reported on 01/13/2022 02/16/20   Muthersbaugh, Dahlia Client, PA-C  ? ? ?Physical Exam ?Vitals: Blood pressure 122/70, weight 200 lb (90.7 kg), last menstrual period 11/16/2021. ? ?General: NAD ?HEENT: normocephalic, anicteric ?Thyroid: no enlargement, no palpable nodules ?Pulmonary: No increased work of breathing, CTAB ?Cardiovascular: RRR, distal pulses 2+ ?Abdomen: NABS, soft, non-tender, non-distended.  Umbilicus without lesions.  No hepatomegaly, splenomegaly or masses palpable. No evidence of hernia  ?Genitourinary: ? External: Normal external female genitalia.  Normal urethral meatus, normal  Bartholin's and Skene's glands.   ? Vagina: Normal vaginal mucosa, no evidence of prolapse.   ? Cervix: Grossly normal in appearance, no bleeding ? Uterus: anteverted,Non-enlarged, mobile, normal contour.  No  CMT ? Adnexa: ovaries non-enlarged, no adnexal masses ? Rectal: deferred ?Extremities: no edema, erythema, or tenderness ?Neurologic: Grossly intact ?Psychiatric: mood appropriate, affect full ? ? ?Assessment: 31 y.o. G2P1001 at [redacted]w[redacted]d presenting to initiate prenatal care ? ?Plan: ?1) Avoid alcoholic beverages. ?2) Patient encouraged not to smoke.  ?3) Discontinue the use of all non-medicinal drugs and chemicals.  ?4) Take prenatal vitamins daily.  ?5) Nutrition, food safety (fish, cheese advisories, and high nitrite foods) and exercise discussed. ?6) Hospital and practice style discussed with cross coverage system.  ?7) Genetic Screening, such as with 1st Trimester Screening, cell free fetal DNA, AFP testing, and Ultrasound, as well as with amniocentesis and CVS as appropriate, is discussed with patient. At the conclusion of today's visit patient declined genetic testing ?8) Patient is asked about travel to areas at risk for the Zika virus, and counseled  to avoid travel and exposure to mosquitoes or sexual partners who may have themselves been exposed to the virus. Testing is discussed, and will be ordered as appropriate.  ?Pap and cultures today. Will have the blood work at her next visit. ?Rx for Nausea sent for her. ? ? ? ? ? ?

## 2022-01-14 LAB — CERVICOVAGINAL ANCILLARY ONLY
Bacterial Vaginitis (gardnerella): NEGATIVE
Chlamydia: NEGATIVE
Comment: NEGATIVE
Comment: NEGATIVE
Comment: NEGATIVE
Comment: NORMAL
Neisseria Gonorrhea: NEGATIVE
Trichomonas: NEGATIVE

## 2022-01-15 LAB — URINE CULTURE

## 2022-01-15 LAB — CYTOLOGY - PAP: Diagnosis: NEGATIVE

## 2022-01-17 LAB — URINE DRUG PANEL 7
Amphetamines, Urine: NEGATIVE ng/mL
Barbiturate Quant, Ur: NEGATIVE ng/mL
Benzodiazepine Quant, Ur: NEGATIVE ng/mL
Cannabinoid Quant, Ur: POSITIVE — AB
Cocaine (Metab.): NEGATIVE ng/mL
Opiate Quant, Ur: NEGATIVE ng/mL
PCP Quant, Ur: NEGATIVE ng/mL

## 2022-02-03 ENCOUNTER — Ambulatory Visit
Admission: RE | Admit: 2022-02-03 | Discharge: 2022-02-03 | Disposition: A | Discharge status: Home / Self Care | Payer: Medicaid Other | Source: Ambulatory Visit | Attending: Obstetrics | Admitting: Obstetrics

## 2022-02-03 DIAGNOSIS — Z3687 Encounter for antenatal screening for uncertain dates: Secondary | ICD-10-CM | POA: Diagnosis not present

## 2022-02-03 DIAGNOSIS — Z3A10 10 weeks gestation of pregnancy: Secondary | ICD-10-CM | POA: Insufficient documentation

## 2022-02-03 DIAGNOSIS — Z3480 Encounter for supervision of other normal pregnancy, unspecified trimester: Secondary | ICD-10-CM

## 2022-02-10 ENCOUNTER — Ambulatory Visit (INDEPENDENT_AMBULATORY_CARE_PROVIDER_SITE_OTHER): Payer: Medicaid Other | Admitting: Licensed Practical Nurse

## 2022-02-10 ENCOUNTER — Encounter: Payer: Self-pay | Admitting: Licensed Practical Nurse

## 2022-02-10 VITALS — BP 120/72 | Wt 201.0 lb

## 2022-02-10 DIAGNOSIS — Z3481 Encounter for supervision of other normal pregnancy, first trimester: Secondary | ICD-10-CM

## 2022-02-10 DIAGNOSIS — Z1379 Encounter for other screening for genetic and chromosomal anomalies: Secondary | ICD-10-CM

## 2022-02-10 DIAGNOSIS — Z3480 Encounter for supervision of other normal pregnancy, unspecified trimester: Secondary | ICD-10-CM

## 2022-02-10 DIAGNOSIS — O219 Vomiting of pregnancy, unspecified: Secondary | ICD-10-CM

## 2022-02-10 DIAGNOSIS — Z3A12 12 weeks gestation of pregnancy: Secondary | ICD-10-CM

## 2022-02-10 LAB — POCT URINALYSIS DIPSTICK OB
Glucose, UA: NEGATIVE
POC,PROTEIN,UA: NEGATIVE

## 2022-02-10 MED ORDER — DOXYLAMINE-PYRIDOXINE 10-10 MG PO TBEC: 2.0000 | DELAYED_RELEASE_TABLET | Freq: Every day | ORAL | 5 refills | Status: DC

## 2022-02-10 NOTE — Progress Notes (Signed)
Routine Prenatal Care Visit ? ?Subjective  ?Alexandra Olson is a 31 y.o. G2P1001 at [redacted]w[redacted]d being seen today for ongoing prenatal care.  She is currently monitored for the following issues for this low-risk pregnancy and has Supervision of other normal pregnancy, antepartum on their problem list.  ?----------------------------------------------------------------------------------- ?Patient reports   Here with son.  Continues to have nausea and is out of Zofran, desires to use Diclegis, she used it with her last pregnancy and it worked well. Feels Zofran only works in the moment.  Mood has been up and and down, tearful today when asked about mood.  She has experienced a lot of loss since January.  Would be open to therapy, but concerned about not having time or a way to pay for it.  Does not want to start medication-it makes her feel like a zombie. Coping strategies reviewed. Overall pt seems like she is in a  good place but is sad d/t the loss she has experienced.  ?Reviewed dating [place], EDD changed to 08/30/2022 ?Contractions: Not present. Vag. Bleeding: None.   . Leaking Fluid denies.  ?----------------------------------------------------------------------------------- ?The following portions of the patient's history were reviewed and updated as appropriate: allergies, current medications, past family history, past medical history, past social history, past surgical history and problem list. Problem list updated. ? ?Objective  ?Blood pressure 120/72, weight 201 lb (91.2 kg), last menstrual period 11/16/2021. ?Pregravid weight 200 lb (90.7 kg) Total Weight Gain 1 lb (0.454 kg) ?Urinalysis: Urine Protein    Urine Glucose   ? ?Fetal Status: Fetal Heart Rate (bpm): 160        ? ?General:  Alert, oriented and cooperative. Patient is in no acute distress.  ?Skin: Skin is warm and dry. No rash noted.   ?Cardiovascular: Normal heart rate noted  ?Respiratory: Normal respiratory effort, no problems with respiration noted   ?Abdomen: Soft, gravid, appropriate for gestational age. Pain/Pressure: Present     ?Pelvic:  Cervical exam deferred        ?Extremities: Normal range of motion.     ?Mental Status: Normal mood and affect. Normal behavior. Normal judgment and thought content.  ? ?Assessment  ? ?31 y.o. G2P1001 at [redacted]w[redacted]d by  08/30/2022, by Ultrasound presenting for routine prenatal visit ? ?Plan  ? ?pregnancy Problems (from 01/13/22 to present)   ? ? Problem Noted Resolved  ? Supervision of other normal pregnancy, antepartum 01/13/2022 by Mirna Mires, CNM No  ? Overview Addendum 02/10/2022 10:26 AM by Ellwood Sayers, CNM  ?   ?Nursing Staff Provider  ?Office Location  [facility] Dating  [redacted]w[redacted]d 4/3 EDD changed   ?Language  English Anatomy US    ?Flu Vaccine   Genetic Screen  NIPS:   ?TDaP vaccine    Hgb A1C or  ?GTT Early : ?Third trimester :   ?Covid    LAB RESULTS   ?Rhogam   Blood Type     ?Feeding Plan  Antibody    ?Contraception  Rubella    ?Circumcision  RPR     ?Pediatrician   HBsAg     ?Support Person  HIV    ?Prenatal Classes  Varicella   ?  GBS  (For PCN allergy, check sensitivities)   ?BTL Consent     ?VBAC Consent  Pap    ?  Hgb Electro    ?  CF   ?   SMA   ?     ? ? ?  ?  ? ?  ?  ? ?  general obstetric precautions including but not limited to vaginal bleeding, contractions, leaking of fluid and fetal movement were reviewed in detail with the patient. ?Please refer to After Visit Summary for other counseling recommendations.  ? ?Return in about 4 weeks (around 03/10/2022) for ROB. ? ?NOB and genetic labs collected ?Script for Diclegis sent, HO for OTC given as well ? ?Carie Caddy, CNM  ?Domingo Pulse, [facility]  ?02/10/22  ?10:37 AM  ? ? ?

## 2022-02-10 NOTE — Addendum Note (Signed)
Addended by: Inis Sizer on: 02/10/2022 10:39 AM ? ? Modules accepted: Orders ? ?

## 2022-02-15 LAB — MATERNIT 21 PLUS CORE, BLOOD
Fetal Fraction: 10
Result (T21): NEGATIVE
Trisomy 13 (Patau syndrome): NEGATIVE
Trisomy 18 (Edwards syndrome): NEGATIVE
Trisomy 21 (Down syndrome): NEGATIVE

## 2022-03-10 ENCOUNTER — Encounter: Payer: Medicaid Other | Admitting: Obstetrics

## 2022-03-11 ENCOUNTER — Ambulatory Visit (INDEPENDENT_AMBULATORY_CARE_PROVIDER_SITE_OTHER): Payer: Medicaid Other | Admitting: Family Medicine

## 2022-03-11 VITALS — BP 120/70 | Wt 199.0 lb

## 2022-03-11 DIAGNOSIS — O99212 Obesity complicating pregnancy, second trimester: Secondary | ICD-10-CM

## 2022-03-11 DIAGNOSIS — O0990 Supervision of high risk pregnancy, unspecified, unspecified trimester: Secondary | ICD-10-CM

## 2022-03-11 DIAGNOSIS — Z3480 Encounter for supervision of other normal pregnancy, unspecified trimester: Secondary | ICD-10-CM

## 2022-03-11 NOTE — Progress Notes (Signed)
? ? ?  PRENATAL VISIT NOTE ? ?Subjective:  ?Alexandra Olson is a 31 y.o. G2P1001 at [redacted]w[redacted]d being seen today for ongoing prenatal care.  She is currently monitored for the following issues for this high-risk pregnancy and has Supervision of high risk pregnancy, antepartum on their problem list. ? ?Patient reports no complaints.  Contractions: Not present. Vag. Bleeding: None.  Movement: Present. Denies leaking of fluid.  ? ?The following portions of the patient's history were reviewed and updated as appropriate: allergies, current medications, past family history, past medical history, past social history, past surgical history and problem list.  ? ?Objective:  ? ?Vitals:  ? 03/11/22 1100  ?BP: 120/70  ?Weight: 199 lb (90.3 kg)  ? ? ?Fetal Status:     Movement: Present    ? ?General:  Alert, oriented and cooperative. Patient is in no acute distress.  ?Skin: Skin is warm and dry. No rash noted.   ?Cardiovascular: Normal heart rate noted  ?Respiratory: Normal respiratory effort, no problems with respiration noted  ?Abdomen: Soft, gravid, appropriate for gestational age.  Pain/Pressure: Absent     ?Pelvic: Cervical exam deferred        ?Extremities: Normal range of motion.     ?Mental Status: Normal mood and affect. Normal behavior. Normal judgment and thought content.  ? ?Assessment and Plan:  ?Pregnancy: G2P1001 at [redacted]w[redacted]d ?1. Supervision of other normal pregnancy, antepartum ?After patient left noted that she never had new OB panel drawn and only had Maternity21 testing ?Patient was called and able to return for labs ?Feeling well but is dealing with multiple deaths in her world with 10 people dying since January ?Offered support -- continue to monitor ?Reviewed need for anatomy US- ordered today ? ?2. Supervision of high risk pregnancy, antepartum ?- [place] MFM OB DETAIL +14 WK; Future ? ?3. Obesity affecting pregnancy in second trimester ?TWG=-1 lb (-0.454 kg) which is at goal for early pregnancy with little to no weight  gain.  ?- [place] MFM OB DETAIL +14 WK; Future ? ?Preterm labor symptoms and general obstetric precautions including but not limited to vaginal bleeding, contractions, leaking of fluid and fetal movement were reviewed in detail with the patient. ?Please refer to After Visit Summary for other counseling recommendations.  ? ?Return in about 4 weeks (around 04/08/2022) for Routine prenatal care. ? ?Future Appointments  ?Date Time Provider Department Center  ?04/08/2022  9:35 AM [name], CNM [facility] None  ? ? ?Federico Flake, MD ?

## 2022-03-12 LAB — RPR+RH+ABO+RUB AB+AB SCR+CB...
Antibody Screen: NEGATIVE
HIV Screen 4th Generation wRfx: NONREACTIVE
Hematocrit: 36.2 % (ref 34.0–46.6)
Hemoglobin: 12.2 g/dL (ref 11.1–15.9)
Hepatitis B Surface Ag: NEGATIVE
MCH: 31.1 pg (ref 26.6–33.0)
MCHC: 33.7 g/dL (ref 31.5–35.7)
MCV: 92 fL (ref 79–97)
Platelets: 295 10*3/uL (ref 150–450)
RBC: 3.92 x10E6/uL (ref 3.77–5.28)
RDW: 12.6 % (ref 11.7–15.4)
RPR Ser Ql: NONREACTIVE
Rh Factor: POSITIVE
Rubella Antibodies, IGG: 5.22 index (ref >0.99)
Varicella zoster IgG: 985 index (ref >165)
WBC: 10.1 10*3/uL (ref 3.4–10.8)

## 2022-03-12 LAB — TSH: TSH: 2.23 u[IU]/mL (ref 0.450–4.500)

## 2022-03-12 LAB — HEMOGLOBIN A1C
Est. average glucose Bld gHb Est-mCnc: 100 mg/dL
Hgb A1c MFr Bld: 5.1 % (ref 4.8–5.6)

## 2022-04-08 ENCOUNTER — Other Ambulatory Visit: Payer: Self-pay

## 2022-04-08 ENCOUNTER — Encounter: Payer: Medicaid Other | Admitting: Advanced Practice Midwife

## 2022-04-08 ENCOUNTER — Ambulatory Visit: Payer: Medicaid Other | Attending: Obstetrics and Gynecology

## 2022-04-08 ENCOUNTER — Ambulatory Visit: Payer: Medicaid Other

## 2022-04-08 ENCOUNTER — Ambulatory Visit (INDEPENDENT_AMBULATORY_CARE_PROVIDER_SITE_OTHER): Payer: Medicaid Other | Admitting: Family Medicine

## 2022-04-08 ENCOUNTER — Encounter: Payer: Medicaid Other | Admitting: Obstetrics

## 2022-04-08 VITALS — BP 100/70 | Wt 210.0 lb

## 2022-04-08 DIAGNOSIS — O43199 Other malformation of placenta, unspecified trimester: Secondary | ICD-10-CM | POA: Insufficient documentation

## 2022-04-08 DIAGNOSIS — O43122 Velamentous insertion of umbilical cord, second trimester: Secondary | ICD-10-CM | POA: Insufficient documentation

## 2022-04-08 DIAGNOSIS — Z363 Encounter for antenatal screening for malformations: Secondary | ICD-10-CM | POA: Insufficient documentation

## 2022-04-08 DIAGNOSIS — Z3480 Encounter for supervision of other normal pregnancy, unspecified trimester: Secondary | ICD-10-CM

## 2022-04-08 DIAGNOSIS — O99210 Obesity complicating pregnancy, unspecified trimester: Secondary | ICD-10-CM | POA: Insufficient documentation

## 2022-04-08 DIAGNOSIS — O99212 Obesity complicating pregnancy, second trimester: Secondary | ICD-10-CM | POA: Insufficient documentation

## 2022-04-08 DIAGNOSIS — Z3A19 19 weeks gestation of pregnancy: Secondary | ICD-10-CM | POA: Diagnosis not present

## 2022-04-08 DIAGNOSIS — O0990 Supervision of high risk pregnancy, unspecified, unspecified trimester: Secondary | ICD-10-CM

## 2022-04-08 LAB — POCT URINALYSIS DIPSTICK OB
Glucose, UA: NEGATIVE
POC,PROTEIN,UA: NEGATIVE

## 2022-04-08 NOTE — Progress Notes (Signed)
    PRENATAL VISIT NOTE  Subjective:  Alexandra Olson is a 31 y.o. G2P1001 at [redacted]w[redacted]d being seen today for ongoing prenatal care.  She is currently monitored for the following issues for this low-risk pregnancy and has Supervision of other normal pregnancy, antepartum; Marginal insertion of umbilical cord affecting management of mother; and Obesity affecting pregnancy on their problem list.  Patient reports  dry skin .  Contractions: Not present. Vag. Bleeding: None.  Movement: Present. Denies leaking of fluid.   The following portions of the patient's history were reviewed and updated as appropriate: allergies, current medications, past family history, past medical history, past social history, past surgical history and problem list.   Objective:   Vitals:   04/08/22 1532  BP: 100/70  Weight: 210 lb (95.3 kg)    Fetal Status: Fetal Heart Rate (bpm): 140 Fundal Height: 20 cm Movement: Present     General:  Alert, oriented and cooperative. Patient is in no acute distress.  Skin: Skin is warm and dry. No rash noted.   Cardiovascular: Normal heart rate noted  Respiratory: Normal respiratory effort, no problems with respiration noted  Abdomen: Soft, gravid, appropriate for gestational age.  Pain/Pressure: Absent     Pelvic: Cervical exam deferred        Extremities: Normal range of motion.     Mental Status: Normal mood and affect. Normal behavior. Normal judgment and thought content.   Assessment and Plan:  Pregnancy: G2P1001 at [redacted]w[redacted]d 1. [redacted] weeks gestation of pregnancy - POC Urinalysis Dipstick OB  2. Supervision of other normal pregnancy, antepartum TWG=10 lb (4.536 kg)  Reviewed safety of steroid creams in pregnancy for eczema on arms and breasts Reviewed plan of care and [place] results  3. Marginal cord - [place] in 5 weeks for growth per MFM  Preterm labor symptoms and general obstetric precautions including but not limited to vaginal bleeding, contractions, leaking of fluid and  fetal movement were reviewed in detail with the patient. Please refer to After Visit Summary for other counseling recommendations.   Return in about 4 weeks (around 05/06/2022) for Routine prenatal care.  Future Appointments  Date Time Provider Department Center  05/13/2022 10:00 AM [facility]    Federico Flake, MD

## 2022-04-08 NOTE — Progress Notes (Signed)
ROB- dry skin on arms and around nipples, no OB concerns

## 2022-05-09 ENCOUNTER — Ambulatory Visit (INDEPENDENT_AMBULATORY_CARE_PROVIDER_SITE_OTHER): Payer: Medicaid Other | Admitting: Advanced Practice Midwife

## 2022-05-09 ENCOUNTER — Encounter: Payer: Self-pay | Admitting: Advanced Practice Midwife

## 2022-05-09 VITALS — BP 128/80 | Wt 220.0 lb

## 2022-05-09 DIAGNOSIS — Z113 Encounter for screening for infections with a predominantly sexual mode of transmission: Secondary | ICD-10-CM

## 2022-05-09 DIAGNOSIS — Z131 Encounter for screening for diabetes mellitus: Secondary | ICD-10-CM

## 2022-05-09 DIAGNOSIS — Z369 Encounter for antenatal screening, unspecified: Secondary | ICD-10-CM

## 2022-05-09 DIAGNOSIS — Z3480 Encounter for supervision of other normal pregnancy, unspecified trimester: Secondary | ICD-10-CM

## 2022-05-09 DIAGNOSIS — Z3A23 23 weeks gestation of pregnancy: Secondary | ICD-10-CM

## 2022-05-09 DIAGNOSIS — Z130 Encounter for screening for diseases of the blood and blood-forming organs and certain disorders involving the immune mechanism: Secondary | ICD-10-CM

## 2022-05-09 LAB — POCT URINALYSIS DIPSTICK OB
Glucose, UA: NEGATIVE
POC,PROTEIN,UA: NEGATIVE

## 2022-05-09 NOTE — Progress Notes (Signed)
Routine Prenatal Care Visit  Subjective  Alexandra Olson is a 31 y.o. G2P1001 at [redacted]w[redacted]d being seen today for ongoing prenatal care.  She is currently monitored for the following issues for this low-risk pregnancy and has Supervision of other normal pregnancy, antepartum; Marginal insertion of umbilical cord affecting management of mother; and Obesity affecting pregnancy on their problem list.  ----------------------------------------------------------------------------------- Patient reports hip pain and ligament pain. Comfort measures discussed. Contractions: Not present. Vag. Bleeding: None.  Movement: Present. Leaking Fluid denies.  ----------------------------------------------------------------------------------- The following portions of the patient's history were reviewed and updated as appropriate: allergies, current medications, past family history, past medical history, past social history, past surgical history and problem list. Problem list updated.  Objective  Blood pressure 128/80, weight 220 lb (99.8 kg), last menstrual period 11/16/2021. Pregravid weight 200 lb (90.7 kg) Total Weight Gain 20 lb (9.072 kg) Urinalysis: Urine Protein    Urine Glucose    Fetal Status: Fetal Heart Rate (bpm): 147 Fundal Height: 24 cm Movement: Present     General:  Alert, oriented and cooperative. Patient is in no acute distress.  Skin: Skin is warm and dry. No rash noted.   Cardiovascular: Normal heart rate noted  Respiratory: Normal respiratory effort, no problems with respiration noted  Abdomen: Soft, gravid, appropriate for gestational age. Pain/Pressure: Absent     Pelvic:  Cervical exam deferred        Extremities: Normal range of motion.  Edema: None  Mental Status: Normal mood and affect. Normal behavior. Normal judgment and thought content.   Assessment   31 y.o. G2P1001 at [redacted]w[redacted]d by  08/30/2022, by Ultrasound presenting for routine prenatal visit  Plan   pregnancy Problems (from  01/13/22 to present)    Problem Noted Resolved   Marginal insertion of umbilical cord affecting management of mother 04/08/2022 by Federico Flake, MD No   Obesity affecting pregnancy 04/08/2022 by Federico Flake, MD No   Supervision of other normal pregnancy, antepartum 01/13/2022 by Mirna Mires, CNM No   Overview Addendum 04/08/2022  3:51 PM by Federico Flake, MD     Nursing Staff Provider  Office Location  [facility] Dating  [redacted]w[redacted]d 4/3 EDD changed   Language  English Anatomy US    Flu Vaccine   Genetic Screen  NIPS: low risk female  TDaP vaccine    Hgb A1C or  GTT Early : Third trimester :   Covid    LAB RESULTS   Rhogam   Blood Type A/Positive/-- (05/09 1136)   Feeding Plan  Antibody Negative (05/09 1136)  Contraception  Rubella 5.22 (05/09 1136)  Circumcision  NA RPR Non Reactive (05/09 1136)   Pediatrician   HBsAg Negative (05/09 1136)   Support Person  HIV Non Reactive (05/09 1136)  Prenatal Classes  Varicella     GBS  (For PCN allergy, check sensitivities)   BTL Consent     VBAC Consent  Pap      Hgb Electro      CF      SMA                   Preterm labor symptoms and general obstetric precautions including but not limited to vaginal bleeding, contractions, leaking of fluid and fetal movement were reviewed in detail with the patient. Please refer to After Visit Summary for other counseling recommendations.   Return in about 4 weeks (around 06/06/2022) for 28 wk labs and rob.  [name], CNM 05/09/2022  9:33 AM

## 2022-05-09 NOTE — Patient Instructions (Signed)
Oral Glucose Tolerance Test During Pregnancy Why am I having this test? The oral glucose tolerance test (OGTT) is done to check how your body processes blood sugar (glucose). This is one of several tests used to diagnose diabetes that develops during pregnancy (gestational diabetes mellitus). Gestational diabetes is a short-term form of diabetes that some women develop while they are pregnant. It usually occurs during the second trimester of pregnancy and goes away after delivery. Testing, or screening, for gestational diabetes usually occurs at weeks 24-28 of pregnancy. You may have the OGTT test after having a 1-hour glucose screening test if the results from that test indicate that you may have gestational diabetes. This test may also be needed if: You have a history of gestational diabetes. There is a history of giving birth to very large babies or of losing pregnancies (having stillbirths). You have signs and symptoms of diabetes, such as: Changes in your eyesight. Tingling or numbness in your hands or feet. Changes in hunger, thirst, and urination, and these are not explained by your pregnancy. What is being tested? This test measures the amount of glucose in your blood at different times during a period of 3 hours. This shows how well your body can process glucose. What kind of sample is taken?  Blood samples are required for this test. They are usually collected by inserting a needle into a blood vessel. How do I prepare for this test? For 3 days before your test, eat normally. Have plenty of carbohydrate-rich foods. Follow instructions from your health care provider about: Eating or drinking restrictions on the day of the test. You may be asked not to eat or drink anything other than water (to fast) starting 8-10 hours before the test. Changing or stopping your regular medicines. Some medicines may interfere with this test. Tell a health care provider about: All medicines you are  taking, including vitamins, herbs, eye drops, creams, and over-the-counter medicines. Any blood disorders you have. Any surgeries you have had. Any medical conditions you have. What happens during the test? First, your blood glucose will be measured. This is referred to as your fasting blood glucose because you fasted before the test. Then, you will drink a glucose solution that contains a certain amount of glucose. Your blood glucose will be measured again 1, 2, and 3 hours after you drink the solution. This test takes about 3 hours to complete. You will need to stay at the testing location during this time. During the testing period: Do not eat or drink anything other than the glucose solution. Do not exercise. Do not use any products that contain nicotine or tobacco, such as cigarettes, e-cigarettes, and chewing tobacco. These can affect your test results. If you need help quitting, ask your health care provider. The testing procedure may vary among health care providers and hospitals. How are the results reported? Your results will be reported as milligrams of glucose per deciliter of blood (mg/dL) or millimoles per liter (mmol/L). There is more than one source for screening and diagnosis reference values used to diagnose gestational diabetes. Your health care provider will compare your results to normal values that were established after testing a large group of people (reference values). Reference values may vary among labs and hospitals. For this test (Carpenter-Coustan), reference values are: Fasting: 95 mg/dL (5.3 mmol/L). 1 hour: 180 mg/dL (10.0 mmol/L). 2 hour: 155 mg/dL (8.6 mmol/L). 3 hour: 140 mg/dL (7.8 mmol/L). What do the results mean? Results below the reference values are   considered normal. If two or more of your blood glucose levels are at or above the reference values, you may be diagnosed with gestational diabetes. If only one level is high, your health care provider may  suggest repeat testing or other tests to confirm a diagnosis. Talk with your health care provider about what your results mean. Questions to ask your health care provider Ask your health care provider, or the department that is doing the test: When will my results be ready? How will I get my results? What are my treatment options? What other tests do I need? What are my next steps? Summary The oral glucose tolerance test (OGTT) is one of several tests used to diagnose diabetes that develops during pregnancy (gestational diabetes mellitus). Gestational diabetes is a short-term form of diabetes that some women develop while they are pregnant. You may have the OGTT test after having a 1-hour glucose screening test if the results from that test show that you may have gestational diabetes. You may also have this test if you have any symptoms or risk factors for this type of diabetes. Talk with your health care provider about what your results mean. This information is not intended to replace advice given to you by your health care provider. Make sure you discuss any questions you have with your health care provider. Document Revised: 03/29/2020 Document Reviewed: 03/29/2020 Elsevier Patient Education  2023 Elsevier Inc.  

## 2022-05-09 NOTE — Addendum Note (Signed)
Addended by: Cornelius Moras D on: 05/09/2022 09:38 AM   Modules accepted: Orders

## 2022-05-13 ENCOUNTER — Ambulatory Visit: Payer: Medicaid Other | Attending: Obstetrics and Gynecology

## 2022-05-13 ENCOUNTER — Other Ambulatory Visit: Payer: Self-pay

## 2022-05-13 VITALS — BP 127/77 | HR 88 | Temp 98.2°F | Ht 67.0 in | Wt 223.5 lb

## 2022-05-13 DIAGNOSIS — O99322 Drug use complicating pregnancy, second trimester: Secondary | ICD-10-CM | POA: Diagnosis not present

## 2022-05-13 DIAGNOSIS — E669 Obesity, unspecified: Secondary | ICD-10-CM | POA: Diagnosis not present

## 2022-05-13 DIAGNOSIS — O99212 Obesity complicating pregnancy, second trimester: Secondary | ICD-10-CM | POA: Insufficient documentation

## 2022-05-13 DIAGNOSIS — Z362 Encounter for other antenatal screening follow-up: Secondary | ICD-10-CM | POA: Insufficient documentation

## 2022-05-13 DIAGNOSIS — O43122 Velamentous insertion of umbilical cord, second trimester: Secondary | ICD-10-CM | POA: Diagnosis not present

## 2022-05-13 DIAGNOSIS — O43199 Other malformation of placenta, unspecified trimester: Secondary | ICD-10-CM

## 2022-05-13 DIAGNOSIS — Z3480 Encounter for supervision of other normal pregnancy, unspecified trimester: Secondary | ICD-10-CM

## 2022-05-13 DIAGNOSIS — Z3A24 24 weeks gestation of pregnancy: Secondary | ICD-10-CM | POA: Diagnosis not present

## 2022-06-03 ENCOUNTER — Ambulatory Visit (INDEPENDENT_AMBULATORY_CARE_PROVIDER_SITE_OTHER): Payer: Medicaid Other | Admitting: Advanced Practice Midwife

## 2022-06-03 ENCOUNTER — Encounter: Payer: Self-pay | Admitting: Advanced Practice Midwife

## 2022-06-03 ENCOUNTER — Other Ambulatory Visit: Payer: Medicaid Other

## 2022-06-03 VITALS — BP 128/74 | Wt 233.0 lb

## 2022-06-03 DIAGNOSIS — Z369 Encounter for antenatal screening, unspecified: Secondary | ICD-10-CM

## 2022-06-03 DIAGNOSIS — Z3A27 27 weeks gestation of pregnancy: Secondary | ICD-10-CM

## 2022-06-03 DIAGNOSIS — Z113 Encounter for screening for infections with a predominantly sexual mode of transmission: Secondary | ICD-10-CM

## 2022-06-03 DIAGNOSIS — Z131 Encounter for screening for diabetes mellitus: Secondary | ICD-10-CM

## 2022-06-03 DIAGNOSIS — Z3482 Encounter for supervision of other normal pregnancy, second trimester: Secondary | ICD-10-CM

## 2022-06-03 DIAGNOSIS — Z130 Encounter for screening for diseases of the blood and blood-forming organs and certain disorders involving the immune mechanism: Secondary | ICD-10-CM

## 2022-06-03 DIAGNOSIS — Z3480 Encounter for supervision of other normal pregnancy, unspecified trimester: Secondary | ICD-10-CM

## 2022-06-03 NOTE — Progress Notes (Signed)
Pt having a lot of swelling. Also developing carpel tunnel. No vb. No lof. 28 week labs today.

## 2022-06-03 NOTE — Patient Instructions (Signed)

## 2022-06-03 NOTE — Progress Notes (Signed)
Routine Prenatal Care Visit  Subjective  Alexandra Olson is a 31 y.o. G2P1001 at [redacted]w[redacted]d being seen today for ongoing prenatal care.  She is currently monitored for the following issues for this low-risk pregnancy and has Supervision of other normal pregnancy, antepartum; Marginal insertion of umbilical cord affecting management of mother; and Obesity affecting pregnancy on their problem list.  ----------------------------------------------------------------------------------- Patient reports low backache and carpal tunnel pain in her fingers. Comfort measures reviewed.   Contractions: Not present. Vag. Bleeding: None.  Movement: Present. Leaking Fluid denies.  ----------------------------------------------------------------------------------- The following portions of the patient's history were reviewed and updated as appropriate: allergies, current medications, past family history, past medical history, past social history, past surgical history and problem list. Problem list updated.  Objective  Blood pressure 128/74, weight 233 lb (105.7 kg), last menstrual period 11/16/2021. Pregravid weight 200 lb (90.7 kg) Total Weight Gain 33 lb (15 kg) Urinalysis: Urine Protein    Urine Glucose    Fetal Status: Fetal Heart Rate (bpm): 148 Fundal Height: 28 cm Movement: Present     General:  Alert, oriented and cooperative. Patient is in no acute distress.  Skin: Skin is warm and dry. No rash noted.   Cardiovascular: Normal heart rate noted  Respiratory: Normal respiratory effort, no problems with respiration noted  Abdomen: Soft, gravid, appropriate for gestational age. Pain/Pressure: Absent     Pelvic:  Cervical exam deferred        Extremities: Normal range of motion.  Edema: Mild pitting, slight indentation  Mental Status: Normal mood and affect. Normal behavior. Normal judgment and thought content.   Assessment   31 y.o. G2P1001 at [redacted]w[redacted]d by  08/30/2022, by Ultrasound presenting for routine  prenatal visit  Plan   pregnancy Problems (from 01/13/22 to present)    Problem Noted Resolved   Marginal insertion of umbilical cord affecting management of mother 04/08/2022 by Federico Flake, MD No   Obesity affecting pregnancy 04/08/2022 by Federico Flake, MD No   Supervision of other normal pregnancy, antepartum 01/13/2022 by Mirna Mires, CNM No   Overview Addendum 04/08/2022  3:51 PM by Federico Flake, MD     Nursing Staff Provider  Office Location  [facility] Dating  [redacted]w[redacted]d 4/3 EDD changed   Language  English Anatomy US    Flu Vaccine   Genetic Screen  NIPS: low risk female  TDaP vaccine    Hgb A1C or  GTT Early : Third trimester :   Covid    LAB RESULTS   Rhogam   Blood Type A/Positive/-- (05/09 1136)   Feeding Plan  Antibody Negative (05/09 1136)  Contraception  Rubella 5.22 (05/09 1136)  Circumcision  NA RPR Non Reactive (05/09 1136)   Pediatrician   HBsAg Negative (05/09 1136)   Support Person  HIV Non Reactive (05/09 1136)  Prenatal Classes  Varicella     GBS  (For PCN allergy, check sensitivities)   BTL Consent     VBAC Consent  Pap      Hgb Electro      CF      SMA                   Preterm labor symptoms and general obstetric precautions including but not limited to vaginal bleeding, contractions, leaking of fluid and fetal movement were reviewed in detail with the patient. Please refer to After Visit Summary for other counseling recommendations.   Return in about 2 weeks (around 06/17/2022) for rob.  [name], CNM 06/03/2022 9:22 AM

## 2022-06-04 ENCOUNTER — Telehealth: Payer: Self-pay

## 2022-06-04 LAB — 28 WEEK RH+PANEL
Basophils Absolute: 0 10*3/uL (ref 0.0–0.2)
Basos: 0 %
EOS (ABSOLUTE): 0.1 10*3/uL (ref 0.0–0.4)
Eos: 1 %
Gestational Diabetes Screen: 94 mg/dL (ref 70–139)
HIV Screen 4th Generation wRfx: NONREACTIVE
Hematocrit: 32.5 % — ABNORMAL LOW (ref 34.0–46.6)
Hemoglobin: 10.9 g/dL — ABNORMAL LOW (ref 11.1–15.9)
Immature Grans (Abs): 0.2 10*3/uL — ABNORMAL HIGH (ref 0.0–0.1)
Immature Granulocytes: 1 %
Lymphocytes Absolute: 1.7 10*3/uL (ref 0.7–3.1)
Lymphs: 15 %
MCH: 30.4 pg (ref 26.6–33.0)
MCHC: 33.5 g/dL (ref 31.5–35.7)
MCV: 91 fL (ref 79–97)
Monocytes Absolute: 0.7 10*3/uL (ref 0.1–0.9)
Monocytes: 6 %
Neutrophils Absolute: 9.1 10*3/uL — ABNORMAL HIGH (ref 1.4–7.0)
Neutrophils: 77 %
Platelets: 287 10*3/uL (ref 150–450)
RBC: 3.59 x10E6/uL — ABNORMAL LOW (ref 3.77–5.28)
RDW: 12.7 % (ref 11.7–15.4)
RPR Ser Ql: NONREACTIVE
WBC: 11.8 10*3/uL — ABNORMAL HIGH (ref 3.4–10.8)

## 2022-06-04 NOTE — Telephone Encounter (Signed)
Pt calling triage wanting to know about her glucose test. Pt aware she passed glucose and if Erskine Squibb needs to address any other levels, she will contact her once she reviews the labs

## 2022-06-17 ENCOUNTER — Ambulatory Visit (INDEPENDENT_AMBULATORY_CARE_PROVIDER_SITE_OTHER): Payer: Medicaid Other | Admitting: Obstetrics & Gynecology

## 2022-06-17 VITALS — BP 112/70 | Wt 238.6 lb

## 2022-06-17 DIAGNOSIS — Z3A29 29 weeks gestation of pregnancy: Secondary | ICD-10-CM

## 2022-06-17 DIAGNOSIS — Z23 Encounter for immunization: Secondary | ICD-10-CM | POA: Diagnosis not present

## 2022-06-17 LAB — POCT URINALYSIS DIPSTICK OB
Glucose, UA: NEGATIVE
POC,PROTEIN,UA: NEGATIVE

## 2022-06-24 ENCOUNTER — Other Ambulatory Visit: Payer: Self-pay

## 2022-06-24 ENCOUNTER — Ambulatory Visit: Payer: Medicaid Other | Attending: Obstetrics and Gynecology

## 2022-06-24 VITALS — BP 132/88 | HR 103 | Temp 98.4°F | Ht 67.0 in | Wt 240.5 lb

## 2022-06-24 DIAGNOSIS — O43129 Velamentous insertion of umbilical cord, unspecified trimester: Secondary | ICD-10-CM | POA: Insufficient documentation

## 2022-06-24 DIAGNOSIS — O43193 Other malformation of placenta, third trimester: Secondary | ICD-10-CM

## 2022-06-24 DIAGNOSIS — O99323 Drug use complicating pregnancy, third trimester: Secondary | ICD-10-CM | POA: Insufficient documentation

## 2022-06-24 DIAGNOSIS — Z3A30 30 weeks gestation of pregnancy: Secondary | ICD-10-CM | POA: Diagnosis not present

## 2022-06-24 DIAGNOSIS — O99212 Obesity complicating pregnancy, second trimester: Secondary | ICD-10-CM

## 2022-06-24 DIAGNOSIS — F1290 Cannabis use, unspecified, uncomplicated: Secondary | ICD-10-CM | POA: Diagnosis not present

## 2022-06-24 DIAGNOSIS — O43123 Velamentous insertion of umbilical cord, third trimester: Secondary | ICD-10-CM | POA: Diagnosis not present

## 2022-06-24 DIAGNOSIS — O99213 Obesity complicating pregnancy, third trimester: Secondary | ICD-10-CM | POA: Insufficient documentation

## 2022-06-24 DIAGNOSIS — Z3480 Encounter for supervision of other normal pregnancy, unspecified trimester: Secondary | ICD-10-CM

## 2022-06-24 DIAGNOSIS — O43199 Other malformation of placenta, unspecified trimester: Secondary | ICD-10-CM

## 2022-06-24 DIAGNOSIS — E669 Obesity, unspecified: Secondary | ICD-10-CM

## 2022-07-01 ENCOUNTER — Ambulatory Visit (INDEPENDENT_AMBULATORY_CARE_PROVIDER_SITE_OTHER): Payer: Medicaid Other | Admitting: Licensed Practical Nurse

## 2022-07-01 VITALS — BP 122/72 | Wt 242.0 lb

## 2022-07-01 DIAGNOSIS — Z3482 Encounter for supervision of other normal pregnancy, second trimester: Secondary | ICD-10-CM

## 2022-07-01 DIAGNOSIS — Z3A31 31 weeks gestation of pregnancy: Secondary | ICD-10-CM

## 2022-07-01 DIAGNOSIS — Z3480 Encounter for supervision of other normal pregnancy, unspecified trimester: Secondary | ICD-10-CM

## 2022-07-01 LAB — POCT URINALYSIS DIPSTICK OB
Appearance: NORMAL
Bilirubin, UA: NEGATIVE
Blood, UA: NEGATIVE
Glucose, UA: NEGATIVE
Ketones, UA: NEGATIVE
Leukocytes, UA: NEGATIVE
Nitrite, UA: NEGATIVE
Odor: NORMAL
POC,PROTEIN,UA: NEGATIVE
Spec Grav, UA: 1.02 (ref 1.010–1.025)
Urobilinogen, UA: 0.2 E.U./dL
pH, UA: 6 (ref 5.0–8.0)

## 2022-07-01 NOTE — Progress Notes (Signed)
Routine Prenatal Care Visit  Subjective  Alexandra Olson is a 31 y.o. G2P1001 at [redacted]w[redacted]d being seen today for ongoing prenatal care.  She is currently monitored for the following issues for this low-risk pregnancy and has Supervision of other normal pregnancy, antepartum; Marginal insertion of umbilical cord affecting management of mother; and Obesity affecting pregnancy on their problem list.  ----------------------------------------------------------------------------------- Patient reports backache and swelling .  Doing well.  Mood has been good.  Sleep is so-so.  Wearing compression stockings for the swelling. Please with previous birth experience, hopes for similar experience.   -Had recent growth [place] that showed normal growth Est. FW:    1714  gm    3 lb 12 oz      64  % Contractions: Not present. Vag. Bleeding: None.  Movement: Present. Leaking Fluid denies.  ----------------------------------------------------------------------------------- The following portions of the patient's history were reviewed and updated as appropriate: allergies, current medications, past family history, past medical history, past social history, past surgical history and problem list. Problem list updated.  Objective  Blood pressure 122/72, weight 242 lb (109.8 kg), last menstrual period 11/16/2021. Pregravid weight 200 lb (90.7 kg) Total Weight Gain 42 lb (19.1 kg) Urinalysis: Urine Protein Negative  Urine Glucose Negative  Fetal Status: Fetal Heart Rate (bpm): 135 Fundal Height: 33 cm Movement: Present     General:  Alert, oriented and cooperative. Patient is in no acute distress.  Skin: Skin is warm and dry. No rash noted.   Cardiovascular: Normal heart rate noted  Respiratory: Normal respiratory effort, no problems with respiration noted  Abdomen: Soft, gravid, appropriate for gestational age. Pain/Pressure: Absent     Pelvic:  Cervical exam deferred        Extremities: Normal range of motion.  Edema:  Mild pitting, slight indentation  Mental Status: Normal mood and affect. Normal behavior. Normal judgment and thought content.   Assessment   31 y.o. G2P1001 at [redacted]w[redacted]d by  08/30/2022, by Ultrasound presenting for routine prenatal visit  Plan   pregnancy Problems (from 01/13/22 to present)     Problem Noted Resolved   Marginal insertion of umbilical cord affecting management of mother 04/08/2022 by Federico Flake, MD No   Obesity affecting pregnancy 04/08/2022 by Federico Flake, MD No   Supervision of other normal pregnancy, antepartum 01/13/2022 by Mirna Mires, CNM No   Overview Addendum 04/08/2022  3:51 PM by Federico Flake, MD     Nursing Staff Provider  Office Location  [facility] Dating  [redacted]w[redacted]d 4/3 EDD changed   Language  English Anatomy US    Flu Vaccine   Genetic Screen  NIPS: low risk female  TDaP vaccine    Hgb A1C or  GTT Early : Third trimester :   Covid    LAB RESULTS   Rhogam   Blood Type A/Positive/-- (05/09 1136)   Feeding Plan  Antibody Negative (05/09 1136)  Contraception  Rubella 5.22 (05/09 1136)  Circumcision  NA RPR Non Reactive (05/09 1136)   Pediatrician   HBsAg Negative (05/09 1136)   Support Person  HIV Non Reactive (05/09 1136)  Prenatal Classes  Varicella     GBS  (For PCN allergy, check sensitivities)   BTL Consent     VBAC Consent  Pap      Hgb Electro      CF      SMA  Preterm labor symptoms and general obstetric precautions including but not limited to vaginal bleeding, contractions, leaking of fluid and fetal movement were reviewed in detail with the patient. Please refer to After Visit Summary for other counseling recommendations.   Return in about 2 weeks (around 07/15/2022) for ROB.  Carie Caddy, CNM  Domingo Pulse, [facility]  07/01/22  9:49 AM

## 2022-07-07 NOTE — Progress Notes (Signed)
Subjective:    Alexandra Olson is a 31 y.o.  G2 P 1078female being seen today for her obstetrical visit. She is at [redacted]w[redacted]d gestation. Patient reports no bleeding, no contractions, no cramping, and no leaking. Fetal movement: normal.  Menstrual History: OB History     Gravida  2   Para  1   Term  1   Preterm  0   AB  0   Living  1      SAB  0   IAB  0   Ectopic  0   Multiple  0   Live Births  1           Menarche age: 18 Patient's last menstrual period was 11/16/2021 (exact date).   The following portions of the patient's history were reviewed and updated as appropriate: allergies, current medications, past family history, past medical history, past social history, past surgical history, and problem list.   Review of Systems A comprehensive review of systems was negative.   Objective:    BP 112/70   Wt 238 lb 9.6 oz (108.2 kg)   LMP 11/16/2021 (Exact Date)   BMI 37.37 kg/m  FHT:  155 BPM  Uterine Size: 31 cm  Presentation: cephalic     Assessment:    Pregnancy 29 and 3/7 weeks   Plan:    28-week labs reviewed, normal Labor precautions given Follow up in 2 Weeks.   Tdap given Linzie Collin, MD  07/07/2022 6:23 PM

## 2022-07-15 ENCOUNTER — Encounter: Payer: Self-pay | Admitting: Advanced Practice Midwife

## 2022-07-15 ENCOUNTER — Ambulatory Visit (INDEPENDENT_AMBULATORY_CARE_PROVIDER_SITE_OTHER): Payer: Medicaid Other | Admitting: Advanced Practice Midwife

## 2022-07-15 VITALS — BP 120/80 | Wt 249.0 lb

## 2022-07-15 DIAGNOSIS — Z3483 Encounter for supervision of other normal pregnancy, third trimester: Secondary | ICD-10-CM

## 2022-07-15 DIAGNOSIS — Z3A33 33 weeks gestation of pregnancy: Secondary | ICD-10-CM

## 2022-07-15 LAB — POCT URINALYSIS DIPSTICK OB
Glucose, UA: NEGATIVE
POC,PROTEIN,UA: NEGATIVE

## 2022-07-15 NOTE — Progress Notes (Signed)
Routine Prenatal Care Visit  Subjective  Alexandra Olson is a 31 y.o. G2P1001 at [redacted]w[redacted]d being seen today for ongoing prenatal care.  She is currently monitored for the following issues for this low-risk pregnancy and has Supervision of other normal pregnancy, antepartum; Marginal insertion of umbilical cord affecting management of mother; and Obesity affecting pregnancy on their problem list.  ----------------------------------------------------------------------------------- Patient reports no complaints.  She has [place] the [place] mid [place]. Precautions reviewed. She has a follow up growth scan planned early October. Contractions: Not present. Vag. Bleeding: None.  Movement: Present. Leaking Fluid denies.  ----------------------------------------------------------------------------------- The following portions of the patient's history were reviewed and updated as appropriate: allergies, current medications, past family history, past medical history, past social history, past surgical history and problem list. Problem list updated.  Objective  Blood pressure 120/80, weight 249 lb (112.9 kg), last menstrual period 11/16/2021. Pregravid weight 200 lb (90.7 kg) Total Weight Gain 49 lb (22.2 kg) Urinalysis: Urine Protein    Urine Glucose    Fetal Status: Fetal Heart Rate (bpm): 145 Fundal Height: 35 cm Movement: Present     General:  Alert, oriented and cooperative. Patient is in no acute distress.  Skin: Skin is warm and dry. No rash noted.   Cardiovascular: Normal heart rate noted  Respiratory: Normal respiratory effort, no problems with respiration noted  Abdomen: Soft, gravid, appropriate for gestational age. Pain/Pressure: Present     Pelvic:  Cervical exam deferred        Extremities: Normal range of motion.  Edema: None  Mental Status: Normal mood and affect. Normal behavior. Normal judgment and thought content.   Assessment   31 y.o. G2P1001 at [redacted]w[redacted]d by  08/30/2022,  by Ultrasound presenting for routine prenatal visit  Plan   pregnancy Problems (from 01/13/22 to present)    Problem Noted Resolved   Marginal insertion of umbilical cord affecting management of mother 04/08/2022 by Federico Flake, MD No   Obesity affecting pregnancy 04/08/2022 by Federico Flake, MD No   Supervision of other normal pregnancy, antepartum 01/13/2022 by Mirna Mires, CNM No   Overview Addendum 07/01/2022  9:49 AM by Ellwood Sayers, CNM     Nursing Staff Provider  Office Location  [facility] Dating  [redacted]w[redacted]d 4/3 EDD changed   Language  English Anatomy US  Normal, marginal cord insert  Flu Vaccine   Genetic Screen  NIPS: low risk female  TDaP vaccine  06/17/2022 Hgb A1C or  GTT Early : Third trimester : 62  Covid    LAB RESULTS   Rhogam  NA Blood Type A/Positive/-- (05/09 1136)   Feeding Plan  Antibody Negative (05/09 1136)  Contraception  Rubella 5.22 (05/09 1136)  Circumcision  NA RPR Non Reactive (05/09 1136)   Pediatrician   HBsAg Negative (05/09 1136)   Support Person Husband HIV Non Reactive (05/09 1136)  Prenatal Classes multip Varicella Immune     GBS  (For PCN allergy, check sensitivities)   BTL Consent     VBAC Consent  Pap  01/13/2022 NILM     Hgb Electro      CF      SMA                   Preterm labor symptoms and general obstetric precautions including but not limited to vaginal bleeding, contractions, leaking of fluid and fetal movement were reviewed in detail with the patient. Please refer to After Visit Summary for other  counseling recommendations.   Return in about 2 weeks (around 07/29/2022) for rob.  [name], CNM 07/15/2022 9:19 AM

## 2022-07-15 NOTE — Addendum Note (Signed)
Addended by: Cornelius Moras D on: 07/15/2022 10:23 AM   Modules accepted: Orders

## 2022-07-15 NOTE — Addendum Note (Signed)
Addended by: [name] on: 07/15/2022 09:32 AM   Modules accepted: Orders

## 2022-07-29 ENCOUNTER — Encounter: Payer: Self-pay | Admitting: Licensed Practical Nurse

## 2022-07-29 ENCOUNTER — Ambulatory Visit (INDEPENDENT_AMBULATORY_CARE_PROVIDER_SITE_OTHER): Payer: Medicaid Other | Admitting: Licensed Practical Nurse

## 2022-07-29 VITALS — BP 124/74 | Wt 248.0 lb

## 2022-07-29 DIAGNOSIS — Z3480 Encounter for supervision of other normal pregnancy, unspecified trimester: Secondary | ICD-10-CM

## 2022-07-29 DIAGNOSIS — Z3A35 35 weeks gestation of pregnancy: Secondary | ICD-10-CM

## 2022-07-29 NOTE — Progress Notes (Signed)
Routine Prenatal Care Visit  Subjective  Alexandra Olson is a 31 y.o. G2P1001 at [redacted]w[redacted]d being seen today for ongoing prenatal care.  She is currently monitored for the following issues for this low-risk pregnancy and has Supervision of other normal pregnancy, antepartum; Marginal insertion of umbilical cord affecting management of mother; and Obesity affecting pregnancy on their problem list.  ----------------------------------------------------------------------------------- Patient reports no complaints.  Feels ready for baby, her husband will be home for 1 week after the baby is  born Contractions: Not present. Vag. Bleeding: None.  Movement: Present. Leaking Fluid denies.  ----------------------------------------------------------------------------------- The following portions of the patient's history were reviewed and updated as appropriate: allergies, current medications, past family history, past medical history, past social history, past surgical history and problem list. Problem list updated.  Objective  Blood pressure 124/74, weight 248 lb (112.5 kg), last menstrual period 11/16/2021. Pregravid weight 200 lb (90.7 kg) Total Weight Gain 48 lb (21.8 kg) Urinalysis: Urine Protein    Urine Glucose    Fetal Status: Fetal Heart Rate (bpm): 140 Fundal Height: 38 cm Movement: Present     General:  Alert, oriented and cooperative. Patient is in no acute distress.  Skin: Skin is warm and dry. No rash noted.   Cardiovascular: Normal heart rate noted  Respiratory: Normal respiratory effort, no problems with respiration noted  Abdomen: Soft, gravid, appropriate for gestational age. Pain/Pressure: Absent     Pelvic:  Cervical exam deferred        Extremities: Normal range of motion.  Edema: Trace  Mental Status: Normal mood and affect. Normal behavior. Normal judgment and thought content.   Assessment   31 y.o. G2P1001 at [redacted]w[redacted]d by  08/30/2022, by Ultrasound presenting for routine prenatal  visit  Plan   pregnancy Problems (from 01/13/22 to present)     Problem Noted Resolved   Marginal insertion of umbilical cord affecting management of mother 04/08/2022 by Caren Macadam, MD No   Obesity affecting pregnancy 04/08/2022 by Caren Macadam, MD No   Supervision of other normal pregnancy, antepartum 01/13/2022 by Imagene Riches, CNM No   Overview Addendum 07/29/2022  8:29 AM by Allen Derry, [facility] Staff Provider  Office Location  [facility] Dating  [redacted]w[redacted]d 4/3 EDD changed   Language  English Anatomy US  Normal, marginal cord insert  Flu Vaccine   Genetic Screen  NIPS: low risk female  TDaP vaccine  06/17/2022 Hgb A1C or  GTT Early : Third trimester : 2  Covid    LAB RESULTS   Rhogam  NA Blood Type A/Positive/-- (05/09 1136)   Feeding Plan breast Antibody Negative (05/09 1136)  Contraception IUD, vasectomy  Rubella 5.22 (05/09 1136)  Circumcision  NA RPR Non Reactive (05/09 1136)   Pediatrician  Meliton Rattan  HBsAg Negative (05/09 1136)   Support Person Husband HIV Non Reactive (05/09 1136)  Prenatal Classes multip Varicella Immune     GBS  (For PCN allergy, check sensitivities)   BTL Consent     VBAC Consent  Pap  01/13/2022 NILM     Hgb Electro      CF      SMA                    Preterm labor symptoms and general obstetric precautions including but not limited to vaginal bleeding, contractions, leaking of fluid and fetal movement were reviewed in detail with the patient. Please refer to After Visit Summary  for other counseling recommendations.   Return in about 1 week (around 08/05/2022) for ROB.  36 wks labs next visit   Carie Caddy, CNM  [facility]  08/01/22  10:14 AM

## 2022-07-29 NOTE — Progress Notes (Signed)
No vb. No lof.  

## 2022-08-04 ENCOUNTER — Other Ambulatory Visit: Payer: Self-pay

## 2022-08-04 DIAGNOSIS — O99320 Drug use complicating pregnancy, unspecified trimester: Secondary | ICD-10-CM

## 2022-08-04 DIAGNOSIS — O43199 Other malformation of placenta, unspecified trimester: Secondary | ICD-10-CM

## 2022-08-04 DIAGNOSIS — E668 Other obesity: Secondary | ICD-10-CM

## 2022-08-05 ENCOUNTER — Ambulatory Visit: Payer: Medicaid Other | Attending: Obstetrics and Gynecology

## 2022-08-05 ENCOUNTER — Other Ambulatory Visit: Payer: Self-pay

## 2022-08-05 ENCOUNTER — Ambulatory Visit (INDEPENDENT_AMBULATORY_CARE_PROVIDER_SITE_OTHER): Payer: Medicaid Other | Admitting: Obstetrics & Gynecology

## 2022-08-05 ENCOUNTER — Encounter: Payer: Self-pay | Admitting: Obstetrics & Gynecology

## 2022-08-05 VITALS — BP 118/87 | HR 122 | Wt 250.3 lb

## 2022-08-05 VITALS — BP 138/85 | HR 113 | Temp 98.5°F | Ht 67.0 in | Wt 251.0 lb

## 2022-08-05 DIAGNOSIS — O99323 Drug use complicating pregnancy, third trimester: Secondary | ICD-10-CM

## 2022-08-05 DIAGNOSIS — Z3A36 36 weeks gestation of pregnancy: Secondary | ICD-10-CM | POA: Diagnosis not present

## 2022-08-05 DIAGNOSIS — Z3685 Encounter for antenatal screening for Streptococcus B: Secondary | ICD-10-CM

## 2022-08-05 DIAGNOSIS — E669 Obesity, unspecified: Secondary | ICD-10-CM | POA: Diagnosis not present

## 2022-08-05 DIAGNOSIS — O43193 Other malformation of placenta, third trimester: Secondary | ICD-10-CM | POA: Diagnosis not present

## 2022-08-05 DIAGNOSIS — F1290 Cannabis use, unspecified, uncomplicated: Secondary | ICD-10-CM | POA: Diagnosis not present

## 2022-08-05 DIAGNOSIS — Z3480 Encounter for supervision of other normal pregnancy, unspecified trimester: Secondary | ICD-10-CM

## 2022-08-05 DIAGNOSIS — O43199 Other malformation of placenta, unspecified trimester: Secondary | ICD-10-CM

## 2022-08-05 DIAGNOSIS — E668 Other obesity: Secondary | ICD-10-CM | POA: Insufficient documentation

## 2022-08-05 DIAGNOSIS — O99213 Obesity complicating pregnancy, third trimester: Secondary | ICD-10-CM | POA: Diagnosis not present

## 2022-08-05 DIAGNOSIS — O43123 Velamentous insertion of umbilical cord, third trimester: Secondary | ICD-10-CM

## 2022-08-05 DIAGNOSIS — Z3483 Encounter for supervision of other normal pregnancy, third trimester: Secondary | ICD-10-CM

## 2022-08-05 DIAGNOSIS — O99320 Drug use complicating pregnancy, unspecified trimester: Secondary | ICD-10-CM

## 2022-08-05 NOTE — Progress Notes (Signed)
Subjective:    Alexandra Olson is a 31 y.o. female being seen today for her obstetrical visit. She is at [redacted]w[redacted]d gestation. Patient reports backache and heartburn. Fetal movement: normal.  Menstrual History: OB History     Gravida  2   Para  1   Term  1   Preterm  0   AB  0   Living  1      SAB  0   IAB  0   Ectopic  0   Multiple  0   Live Births  1            Patient's last menstrual period was 11/16/2021 (exact date).    The following portions of the patient's history were reviewed and updated as appropriate: allergies, current medications, past family history, past medical history, past social history, past surgical history, and problem list.  Review of Systems Pertinent items are noted in HPI.   Objective:    BP 118/87   Pulse (!) 122   Wt 250 lb 4.8 oz (113.5 kg)   LMP 11/16/2021 (Exact Date)   BMI 39.20 kg/m  FHT: 156 BPM  Uterine Size: 37 cm  Presentations: cephalic  Pelvic Exam:              Dilation:        Effacement:              Station:      Consistency:             Position:      Assessment:    Pregnancy 36 and 3/7 weeks   Plan:   Plans for delivery: Vaginal anticipated Beta strep culture: done today Counseling:  labor precautions Fetal testing:  US done today Follow up in 1 Week.    Rosario Adie, MD  08/05/2022 12:47 PM

## 2022-08-09 LAB — CULTURE, BETA STREP (GROUP B ONLY): Strep Gp B Culture: NEGATIVE

## 2022-08-13 ENCOUNTER — Encounter: Payer: Self-pay | Admitting: Obstetrics and Gynecology

## 2022-08-13 ENCOUNTER — Ambulatory Visit (INDEPENDENT_AMBULATORY_CARE_PROVIDER_SITE_OTHER): Payer: Medicaid Other | Admitting: Obstetrics and Gynecology

## 2022-08-13 VITALS — BP 123/81 | HR 82 | Wt 252.1 lb

## 2022-08-13 DIAGNOSIS — Z3480 Encounter for supervision of other normal pregnancy, unspecified trimester: Secondary | ICD-10-CM

## 2022-08-13 DIAGNOSIS — Z3483 Encounter for supervision of other normal pregnancy, third trimester: Secondary | ICD-10-CM

## 2022-08-13 DIAGNOSIS — Z3A37 37 weeks gestation of pregnancy: Secondary | ICD-10-CM

## 2022-08-13 LAB — POCT URINALYSIS DIPSTICK OB
Bilirubin, UA: NEGATIVE
Blood, UA: NEGATIVE
Glucose, UA: NEGATIVE
Ketones, UA: NEGATIVE
Leukocytes, UA: NEGATIVE
Nitrite, UA: NEGATIVE
Spec Grav, UA: 1.015 (ref 1.010–1.025)
Urobilinogen, UA: 0.2 E.U./dL
pH, UA: 6.5 (ref 5.0–8.0)

## 2022-08-13 NOTE — Progress Notes (Signed)
ROB: Discussed weekly NSTs for marginal cord insertion.  Ordered for next week.  Recent ultrasound showed 65th percentile growth.  Kick counts discussed and recommended.  Cervical check next visit.

## 2022-08-13 NOTE — Progress Notes (Signed)
ROB.Patient states no questions or concerns at this time.  

## 2022-08-18 ENCOUNTER — Ambulatory Visit (INDEPENDENT_AMBULATORY_CARE_PROVIDER_SITE_OTHER): Payer: Medicaid Other

## 2022-08-18 ENCOUNTER — Ambulatory Visit (INDEPENDENT_AMBULATORY_CARE_PROVIDER_SITE_OTHER): Payer: Medicaid Other | Admitting: Certified Nurse Midwife

## 2022-08-18 DIAGNOSIS — Z3480 Encounter for supervision of other normal pregnancy, unspecified trimester: Secondary | ICD-10-CM

## 2022-08-18 DIAGNOSIS — O99213 Obesity complicating pregnancy, third trimester: Secondary | ICD-10-CM

## 2022-08-18 DIAGNOSIS — O43193 Other malformation of placenta, third trimester: Secondary | ICD-10-CM

## 2022-08-18 DIAGNOSIS — O43199 Other malformation of placenta, unspecified trimester: Secondary | ICD-10-CM

## 2022-08-18 DIAGNOSIS — E669 Obesity, unspecified: Secondary | ICD-10-CM | POA: Diagnosis not present

## 2022-08-18 LAB — POCT URINALYSIS DIPSTICK OB
Bilirubin, UA: NEGATIVE
Glucose, UA: NEGATIVE
Ketones, UA: NEGATIVE
Leukocytes, UA: NEGATIVE
Nitrite, UA: NEGATIVE
POC,PROTEIN,UA: NEGATIVE
Spec Grav, UA: 1.015 (ref 1.010–1.025)
Urobilinogen, UA: 0.2 E.U./dL
pH, UA: 6 (ref 5.0–8.0)

## 2022-08-18 NOTE — Patient Instructions (Signed)

## 2022-08-18 NOTE — Progress Notes (Signed)
ROB doing well, feeling good movement. Has lots of pressure. SVE per pt request 3/60/-2. Labor precautions reviewed.   NST: reactive Category 1  Baseline 140 Moderate variability Accelerations present Decelerations absent Ctx absent.   Follow up 1 wks for NST & u/s for AFI , ROB  Philip Aspen, CNM

## 2022-08-18 NOTE — Progress Notes (Signed)
Subjective:    Alexandra Olson is a 31 y.o. female who presents for fetal monitoring per order from Dr.Evans.    Results reviewed and discussed with patient by Philip Aspen, CNM.

## 2022-08-19 ENCOUNTER — Other Ambulatory Visit: Payer: Self-pay

## 2022-08-19 ENCOUNTER — Observation Stay
Admission: EM | Admit: 2022-08-19 | Discharge: 2022-08-19 | Disposition: A | Discharge status: Home / Self Care | Payer: Medicaid Other | Attending: Obstetrics | Admitting: Obstetrics

## 2022-08-19 ENCOUNTER — Encounter: Payer: Self-pay | Admitting: Certified Nurse Midwife

## 2022-08-19 ENCOUNTER — Encounter: Payer: Self-pay | Admitting: Obstetrics and Gynecology

## 2022-08-19 DIAGNOSIS — Z3480 Encounter for supervision of other normal pregnancy, unspecified trimester: Secondary | ICD-10-CM

## 2022-08-19 DIAGNOSIS — O26893 Other specified pregnancy related conditions, third trimester: Secondary | ICD-10-CM

## 2022-08-19 DIAGNOSIS — O99213 Obesity complicating pregnancy, third trimester: Secondary | ICD-10-CM

## 2022-08-19 DIAGNOSIS — R109 Unspecified abdominal pain: Secondary | ICD-10-CM

## 2022-08-19 DIAGNOSIS — Z3A38 38 weeks gestation of pregnancy: Secondary | ICD-10-CM

## 2022-08-19 DIAGNOSIS — O43199 Other malformation of placenta, unspecified trimester: Secondary | ICD-10-CM

## 2022-08-19 DIAGNOSIS — O471 False labor at or after 37 completed weeks of gestation: Secondary | ICD-10-CM | POA: Diagnosis present

## 2022-08-19 MED ORDER — ACETAMINOPHEN 325 MG PO TABS
650.0000 mg | ORAL_TABLET | ORAL | Status: DC | PRN
  Administered 2022-08-19: 650 mg via ORAL
  Filled 2022-08-19: qty 2

## 2022-08-19 MED ORDER — CALCIUM CARBONATE ANTACID 500 MG PO CHEW: 1.0000 | CHEWABLE_TABLET | ORAL | Status: DC | PRN

## 2022-08-19 NOTE — OB Triage Note (Addendum)
LABOR & DELIVERY OB TRIAGE NOTE  SUBJECTIVE  HPI Alexandra Olson is a 31 y.o. G2P1001 at [redacted]w[redacted]d who presents to Labor & Delivery for contractions. Her pregnancy is complicated by a marginal cord insertion. She began having ctx q 20 minutes yesterday afternoon after a cervical exam in the office. She had irregular ctx through the night, which gradually became more regular. By 4492, the ctx were q 5 min. She denies bloody show. She reports increased vaginal discharge but not SROM. She endorses good fetal movement. Over her time in triage, she noted that contractions were stronger when she was lying down and became less frequent/painful when up and walking.  OB History     Gravida  2   Para  1   Term  1   Preterm  0   AB  0   Living  1      SAB  0   IAB  0   Ectopic  0   Multiple  0   Live Births  1           Scheduled Meds: Continuous Infusions: PRN Meds:.acetaminophen, calcium carbonate  OBJECTIVE  BP 129/83 (BP Location: Right Arm)   Pulse 89   Temp (!) 97.5 F (36.4 C) (Oral)   Resp 16   Ht 5\' 7"  (1.702 m)   Wt 113.5 kg   LMP 11/16/2021 (Exact Date)   BMI 39.19 kg/m   Cardiac: Lungs:  Abdomen: Cervix: Dilation: 3 Effacement (%): 60 Station: -3 Presentation: Vertex Exam by:: Alexandra Olson CNM   Unchanged over 2 hours  NST I reviewed the NST and it was reactive.  Baseline: 135 Variability: moderate Accelerations: present Decelerations:none Toco: irregular, mild, q3-5 Category 1  ASSESSMENT Impression  1) Pregnancy at G2P1001, [redacted]w[redacted]d, Estimated Date of Delivery: 08/30/22 2) Reassuring maternal/fetal status 3) No cervical change - not in labor  PLAN   1) Discharge home with standard labor/return precautions 2) Encouraged hydration, rest, Tylenol PM for rest, warm shower, heating pad, [employer] 3) Keep scheduled [place] and ROB appts  Lloyd Huger, CNM

## 2022-08-19 NOTE — OB Triage Note (Signed)
Pt given discharge instructions including follow up care and labor precautions. Pt verbalized understanding and was discharged with significant other.

## 2022-08-19 NOTE — OB Triage Note (Signed)
Pt G2P1 [redacted]w[redacted]d presents for ctx. Reports ctx started last night and have got closer together and stronger. Reports ctx every 5-6 minutes and rates 6/10 cramping/pressure in her lower abd. Denies bleeding. Reports noticing underwear was a little more wet this morning. +FM. VSS. Monitors applied.

## 2022-08-21 ENCOUNTER — Ambulatory Visit
Admission: RE | Admit: 2022-08-21 | Discharge: 2022-08-21 | Disposition: A | Discharge status: Home / Self Care | Payer: Medicaid Other | Source: Ambulatory Visit | Attending: Certified Nurse Midwife | Admitting: Certified Nurse Midwife

## 2022-08-21 DIAGNOSIS — O480 Post-term pregnancy: Secondary | ICD-10-CM | POA: Diagnosis not present

## 2022-08-21 DIAGNOSIS — O43199 Other malformation of placenta, unspecified trimester: Secondary | ICD-10-CM | POA: Diagnosis present

## 2022-08-21 DIAGNOSIS — Z3480 Encounter for supervision of other normal pregnancy, unspecified trimester: Secondary | ICD-10-CM

## 2022-08-21 DIAGNOSIS — O99213 Obesity complicating pregnancy, third trimester: Secondary | ICD-10-CM | POA: Diagnosis present

## 2022-08-21 DIAGNOSIS — Z3A40 40 weeks gestation of pregnancy: Secondary | ICD-10-CM | POA: Diagnosis not present

## 2022-08-23 ENCOUNTER — Inpatient Hospital Stay: Payer: Medicaid Other | Admitting: Anesthesiology

## 2022-08-23 ENCOUNTER — Encounter: Payer: Self-pay | Admitting: Obstetrics and Gynecology

## 2022-08-23 ENCOUNTER — Other Ambulatory Visit: Payer: Self-pay

## 2022-08-23 ENCOUNTER — Inpatient Hospital Stay
Admission: EM | Admit: 2022-08-23 | Discharge: 2022-08-25 | DRG: 807 | Disposition: A | Discharge status: Home / Self Care | Payer: Medicaid Other | Attending: Certified Nurse Midwife | Admitting: Certified Nurse Midwife

## 2022-08-23 DIAGNOSIS — O99214 Obesity complicating childbirth: Secondary | ICD-10-CM | POA: Diagnosis present

## 2022-08-23 DIAGNOSIS — O26893 Other specified pregnancy related conditions, third trimester: Secondary | ICD-10-CM | POA: Diagnosis present

## 2022-08-23 DIAGNOSIS — Z3A39 39 weeks gestation of pregnancy: Secondary | ICD-10-CM | POA: Diagnosis not present

## 2022-08-23 DIAGNOSIS — O99213 Obesity complicating pregnancy, third trimester: Secondary | ICD-10-CM

## 2022-08-23 DIAGNOSIS — O43123 Velamentous insertion of umbilical cord, third trimester: Principal | ICD-10-CM | POA: Diagnosis present

## 2022-08-23 DIAGNOSIS — O43199 Other malformation of placenta, unspecified trimester: Secondary | ICD-10-CM | POA: Diagnosis present

## 2022-08-23 DIAGNOSIS — Z3480 Encounter for supervision of other normal pregnancy, unspecified trimester: Secondary | ICD-10-CM

## 2022-08-23 DIAGNOSIS — O43193 Other malformation of placenta, third trimester: Secondary | ICD-10-CM | POA: Diagnosis not present

## 2022-08-23 LAB — TYPE AND SCREEN
ABO/RH(D): A POS
Antibody Screen: NEGATIVE

## 2022-08-23 LAB — CBC
HCT: 38 % (ref 36.0–46.0)
Hemoglobin: 12.2 g/dL (ref 12.0–15.0)
MCH: 28.5 pg (ref 26.0–34.0)
MCHC: 32.1 g/dL (ref 30.0–36.0)
MCV: 88.8 fL (ref 80.0–100.0)
Platelets: 284 10*3/uL (ref 150–400)
RBC: 4.28 MIL/uL (ref 3.87–5.11)
RDW: 14.1 % (ref 11.5–15.5)
WBC: 11 10*3/uL — ABNORMAL HIGH (ref 4.0–10.5)
nRBC: 0 % (ref 0.0–0.2)

## 2022-08-23 LAB — URINE DRUG SCREEN, QUALITATIVE (ARMC ONLY)
Amphetamines, Ur Screen: NOT DETECTED
Barbiturates, Ur Screen: NOT DETECTED
Benzodiazepine, Ur Scrn: NOT DETECTED
Cannabinoid 50 Ng, Ur ~~LOC~~: NOT DETECTED
Cocaine Metabolite,Ur ~~LOC~~: NOT DETECTED
MDMA (Ecstasy)Ur Screen: NOT DETECTED
Methadone Scn, Ur: NOT DETECTED
Opiate, Ur Screen: NOT DETECTED
Phencyclidine (PCP) Ur S: NOT DETECTED
Tricyclic, Ur Screen: NOT DETECTED

## 2022-08-23 MED ORDER — TERBUTALINE SULFATE 1 MG/ML IJ SOLN: 0.2500 mg | Freq: Once | INTRAMUSCULAR | Status: DC | PRN

## 2022-08-23 MED ORDER — OXYTOCIN BOLUS FROM INFUSION
333.0000 mL | Freq: Once | INTRAVENOUS | Status: AC
  Administered 2022-08-24: 333 mL via INTRAVENOUS

## 2022-08-23 MED ORDER — OXYTOCIN-SODIUM CHLORIDE 30-0.9 UT/500ML-% IV SOLN
1.0000 m[IU]/min | INTRAVENOUS | Status: DC
  Administered 2022-08-23: 4 m[IU]/min via INTRAVENOUS

## 2022-08-23 MED ORDER — EPHEDRINE 5 MG/ML INJ: 10.0000 mg | INTRAVENOUS | Status: DC | PRN

## 2022-08-23 MED ORDER — FENTANYL-BUPIVACAINE-NACL 0.5-0.125-0.9 MG/250ML-% EP SOLN
12.0000 mL/h | EPIDURAL | Status: DC | PRN
  Administered 2022-08-23: 12 mL/h via EPIDURAL

## 2022-08-23 MED ORDER — PHENYLEPHRINE 80 MCG/ML (10ML) SYRINGE FOR IV PUSH (FOR BLOOD PRESSURE SUPPORT): 80.0000 ug | PREFILLED_SYRINGE | INTRAVENOUS | Status: DC | PRN

## 2022-08-23 MED ORDER — LIDOCAINE HCL (PF) 1 % IJ SOLN: 30.0000 mL | INTRAMUSCULAR | Status: DC | PRN

## 2022-08-23 MED ORDER — FENTANYL-BUPIVACAINE-NACL 0.5-0.125-0.9 MG/250ML-% EP SOLN
EPIDURAL | Status: AC
  Filled 2022-08-23: qty 250

## 2022-08-23 MED ORDER — LACTATED RINGERS IV SOLN: 500.0000 mL | INTRAVENOUS | Status: DC | PRN

## 2022-08-23 MED ORDER — LACTATED RINGERS IV SOLN: INTRAVENOUS | Status: DC

## 2022-08-23 MED ORDER — DIPHENHYDRAMINE HCL 50 MG/ML IJ SOLN: 12.5000 mg | INTRAMUSCULAR | Status: DC | PRN

## 2022-08-23 MED ORDER — LIDOCAINE HCL (PF) 1 % IJ SOLN
INTRAMUSCULAR | Status: DC | PRN
  Administered 2022-08-23: 3 mL via SUBCUTANEOUS

## 2022-08-23 MED ORDER — LACTATED RINGERS IV SOLN
500.0000 mL | Freq: Once | INTRAVENOUS | Status: AC
  Administered 2022-08-23: 500 mL via INTRAVENOUS

## 2022-08-23 MED ORDER — SOD CITRATE-CITRIC ACID 500-334 MG/5ML PO SOLN: 30.0000 mL | ORAL | Status: DC | PRN

## 2022-08-23 MED ORDER — FENTANYL CITRATE (PF) 100 MCG/2ML IJ SOLN: 50.0000 ug | INTRAMUSCULAR | Status: DC | PRN

## 2022-08-23 MED ORDER — LIDOCAINE-EPINEPHRINE (PF) 1.5 %-1:200000 IJ SOLN
INTRAMUSCULAR | Status: DC | PRN
  Administered 2022-08-23: 3 mL via EPIDURAL

## 2022-08-23 MED ORDER — ONDANSETRON HCL 4 MG/2ML IJ SOLN: 4.0000 mg | Freq: Four times a day (QID) | INTRAMUSCULAR | Status: DC | PRN

## 2022-08-23 MED ORDER — SODIUM CHLORIDE 0.9 % IV SOLN
INTRAVENOUS | Status: DC | PRN
  Administered 2022-08-23: 8 mL via EPIDURAL

## 2022-08-23 MED ORDER — OXYTOCIN-SODIUM CHLORIDE 30-0.9 UT/500ML-% IV SOLN
2.5000 [IU]/h | INTRAVENOUS | Status: DC
  Filled 2022-08-23: qty 500

## 2022-08-23 MED ORDER — ACETAMINOPHEN 325 MG PO TABS
650.0000 mg | ORAL_TABLET | ORAL | Status: DC | PRN
  Administered 2022-08-24 – 2022-08-25 (×4): 650 mg via ORAL
  Filled 2022-08-23 (×4): qty 2

## 2022-08-23 NOTE — Anesthesia Procedure Notes (Signed)
Epidural Patient location during procedure: OB  Staffing Anesthesiologist: Arita Miss, MD Performed: anesthesiologist   Preanesthetic Checklist Completed: patient identified, IV checked, site marked, risks and benefits discussed, surgical consent, monitors and equipment checked, pre-op evaluation and timeout performed  Epidural Patient position: sitting Prep: ChloraPrep Patient monitoring: heart rate, continuous pulse ox and blood pressure Approach: midline Location: L3-L4 Injection technique: LOR saline  Needle:  Needle type: Tuohy  Needle gauge: 17 G Needle length: 9 cm Needle insertion depth: 5 cm Catheter type: closed end flexible Catheter size: 19 Gauge Catheter at skin depth: 11 cm Test dose: negative and 1.5% lidocaine with Epi 1:200 K  Assessment Sensory level: T10 Events: blood not aspirated, injection not painful, no injection resistance, no paresthesia and negative IV test  Additional Notes One/first attempt Pt. Evaluated and documentation done after procedure finished. Patient identified. Risks/Benefits/Options discussed with patient including but not limited to bleeding, infection, nerve damage, paralysis, failed block, incomplete pain control, headache, blood pressure changes, nausea, vomiting, reactions to medication both or allergic, itching and postpartum back pain. Confirmed with bedside nurse the patient's most recent platelet count. Confirmed with patient that they are not currently taking any anticoagulation, have any bleeding history or any family history of bleeding disorders. Patient expressed understanding and wished to proceed. All questions were answered. Sterile technique was used throughout the entire procedure. Please see nursing notes for vital signs. Test dose was given through epidural catheter and negative prior to continuing to dose epidural or start infusion. Warning signs of high block given to the patient including shortness of breath,  tingling/numbness in hands, complete motor block, or any concerning symptoms with instructions to call for help. Patient was given instructions on fall risk and not to get out of bed. All questions and concerns addressed with instructions to call with any issues or inadequate analgesia.     Patient tolerated the insertion well without immediate complications.  Reason for block: procedure for painReason for block:procedure for pain

## 2022-08-23 NOTE — OB Triage Note (Signed)
Patient is a G2P1001 at [redacted]w[redacted]d who presents to unit c/o ctx since early this morning. Patient reports +FM, denies LOF and vaginal bleeding. External monitors applied and assessing. Initial FHT 150.

## 2022-08-23 NOTE — Progress Notes (Signed)
LABOR NOTE   Alexandra Olson 31 y.o.GP@ at [redacted]w[redacted]d  SUBJECTIVE:  Comfortable with epidural , having some mild pressure Analgesia: Labor support without medications  OBJECTIVE:  BP 134/74 (BP Location: Right Arm)   Pulse 95   Temp 97.8 F (36.6 C) (Oral)   Resp 18   Ht 5\' 7"  (1.702 m)   Wt 113.5 kg   LMP 11/16/2021 (Exact Date)   SpO2 99%   BMI 39.19 kg/m  No intake/output data recorded.  She has not shown cervical change. CERVIX: 7 cm:  90:   -2:   mid position:   soft SVE:   Dilation: 7 Effacement (%): 90 Station: -2 Exam by:: Philip Aspen, CNM CONTRACTIONS: regular, every 4-6 minutes FHR: Fetal heart tracing reviewed. Baseline: 130 bpm, Variability: Good {> 6 bpm), Accelerations: Reactive, and Decelerations: Absent Category I    Labs: Lab Results  Component Value Date   WBC 11.0 (H) 08/23/2022   HGB 12.2 08/23/2022   HCT 38.0 08/23/2022   MCV 88.8 08/23/2022   PLT 284 08/23/2022    ASSESSMENT: 1) Labor curve reviewed.       Progress: Active phase labor.     Membranes: ruptured     clear      Active Problems:   Labor and delivery, indication for care   PLAN: IV Pitocin augmentation, irregular contractions, no cervical change since last exam. Pt in agreement to plan.   Philip Aspen, CNM  08/23/2022 11:16 PM

## 2022-08-23 NOTE — Anesthesia Preprocedure Evaluation (Signed)
Anesthesia Evaluation  Patient identified by MRN, date of birth, ID band Patient awake    Reviewed: Allergy & Precautions, NPO status , Patient's Chart, lab work & pertinent test results  History of Anesthesia Complications Negative for: history of anesthetic complications  Airway Mallampati: II  TM Distance: >3 FB Neck ROM: Full    Dental no notable dental hx. (+) Teeth Intact   Pulmonary neg pulmonary ROS, neg sleep apnea, neg COPD, Patient abstained from smoking.Not current smoker, former smoker,    Pulmonary exam normal breath sounds clear to auscultation       Cardiovascular Exercise Tolerance: Good METS(-) hypertension(-) CAD and (-) Past MI negative cardio ROS  (-) dysrhythmias  Rhythm:Regular Rate:Normal - Systolic murmurs    Neuro/Psych negative neurological ROS  negative psych ROS   GI/Hepatic GERD  ,(+)     (-) substance abuse  ,   Endo/Other  neg diabetes  Renal/GU negative Renal ROS     Musculoskeletal   Abdominal (+) + obese,   Peds  Hematology   Anesthesia Other Findings Past Medical History: No date: GERD (gastroesophageal reflux disease)  Reproductive/Obstetrics (+) Pregnancy                             Anesthesia Physical Anesthesia Plan  ASA: 2  Anesthesia Plan: Epidural   Post-op Pain Management:    Induction:   PONV Risk Score and Plan: 2 and Treatment may vary due to age or medical condition and Ondansetron  Airway Management Planned: Natural Airway  Additional Equipment:   Intra-op Plan:   Post-operative Plan:   Informed Consent: I have reviewed the patients History and Physical, chart, labs and discussed the procedure including the risks, benefits and alternatives for the proposed anesthesia with the patient or authorized representative who has indicated his/her understanding and acceptance.       Plan Discussed with: Surgeon  Anesthesia  Plan Comments: (Discussed R/B/A of neuraxial anesthesia technique with patient: - rare risks of spinal/epidural hematoma, nerve damage, infection - Risk of PDPH - Risk of itching - Risk of nausea and vomiting - Risk of poor block necessitating replacement of epidural. - Risk of allergic reactions. Patient voiced understanding.)        Anesthesia Quick Evaluation

## 2022-08-23 NOTE — H&P (Signed)
History and Physical   HPI  Alexandra Olson is a 31 y.o. G2P1001 at [redacted]w[redacted]d Estimated Date of Delivery: 08/30/22 who is being admitted for labor management. Pregnancy complicated by marginal cord insertion.    OB History  OB History  Gravida Para Term Preterm AB Living  2 1 1  0 0 1  SAB IAB Ectopic Multiple Live Births  0 0 0 0 1    # Outcome Date GA Lbr Len/2nd Weight Sex Delivery Anes PTL Lv  2 Current           1 Term 05/26/15 [redacted]w[redacted]d 17:22 / 00:56  M Vag-Spont EPI  LIV     Apgar1: 9  Apgar5: 9    PROBLEM LIST  Pregnancy complications or risks: Patient Active Problem List   Diagnosis Date Noted   Labor and delivery, indication for care 08/19/2022   Marginal insertion of umbilical cord affecting management of mother 04/08/2022   Obesity affecting pregnancy 04/08/2022   Supervision of other normal pregnancy, antepartum 01/13/2022    Prenatal labs and studies: ABO, Rh: --/--/A POS (10/21 1900) Antibody: NEG (10/21 1900) Rubella: 5.22 (05/09 1136) RPR: Non Reactive (08/01 0930)  HBsAg: Negative (05/09 1136)  HIV: Non Reactive (08/01 0930)  XBM:WUXLKGMW/-- (10/03 1136)   Past Medical History:  Diagnosis Date   GERD (gastroesophageal reflux disease)      Past Surgical History:  Procedure Laterality Date   WISDOM TOOTH EXTRACTION  12/2015     Medications    Current Discharge Medication List     CONTINUE these medications which have NOT CHANGED   Details  cetirizine (ZYRTEC) 10 MG tablet Take 10 mg by mouth daily.    Prenatal Vit-Fe Fumarate-FA (PRENATAL VITAMIN PO) Take by mouth.         Allergies  Zoloft [sertraline hcl]  Review of Systems  Constitutional: negative Eyes: negative Ears, nose, mouth, throat, and face: negative Respiratory: negative Cardiovascular: negative Gastrointestinal: negative Genitourinary:negative Integument/breast: negative Hematologic/lymphatic: negative Musculoskeletal:negative Neurological:  negative Behavioral/Psych: negative Endocrine: negative Allergic/Immunologic: negative  Physical Exam  BP 134/74   Pulse 95   Temp 97.8 F (36.6 C) (Oral)   Resp 18   Ht 5\' 7"  (1.702 m)   Wt 113.5 kg   LMP 11/16/2021 (Exact Date)   SpO2 99%   BMI 39.19 kg/m   Lungs:  CTA B Cardio: RRR without M/R/G Abd: Soft, gravid, NT Presentation: cephalic EXT: No C/C/ 1+ Edema DTRs: 2+ B CERVIX: Dilation: 7 Effacement (%): 90 Station: -2 Presentation: Vertex Exam by:: Philip Aspen, CNM  See Prenatal records for more detailed PE.     FHR:  Baseline: 140 bpm, Variability: Good {> 6 bpm), Accelerations: Reactive, and Decelerations: Absent  Toco: Uterine Contractions: Frequency: Every 4-5 minutes and Duration: 90-120 seconds  Test Results  Results for orders placed or performed during the hospital encounter of 08/23/22 (from the past 24 hour(s))  CBC     Status: Abnormal   Collection Time: 08/23/22  7:00 PM  Result Value Ref Range   WBC 11.0 (H) 4.0 - 10.5 K/uL   RBC 4.28 3.87 - 5.11 MIL/uL   Hemoglobin 12.2 12.0 - 15.0 g/dL   HCT 38.0 36.0 - 46.0 %   MCV 88.8 80.0 - 100.0 fL   MCH 28.5 26.0 - 34.0 pg   MCHC 32.1 30.0 - 36.0 g/dL   RDW 14.1 11.5 - 15.5 %   Platelets 284 150 - 400 K/uL   nRBC 0.0 0.0 - 0.2 %  Type and screen [facility]     Status: None   Collection Time: 08/23/22  7:00 PM  Result Value Ref Range   ABO/RH(D) A POS    Antibody Screen NEG    Sample Expiration      08/26/2022,2359 Performed at [facility], [address] [place], [place] [address]   Urine Drug Screen, [facility] only)     Status: None   Collection Time: 08/23/22  7:02 PM  Result Value Ref Range   Tricyclic, Ur Screen NONE DETECTED NONE DETECTED   Amphetamines, Ur Screen NONE DETECTED NONE DETECTED   MDMA (Ecstasy)Ur Screen NONE DETECTED NONE DETECTED   Cocaine Metabolite,Ur [place] NONE DETECTED NONE DETECTED   Opiate, Ur Screen NONE DETECTED  NONE DETECTED   Phencyclidine (PCP) Ur S NONE DETECTED NONE DETECTED   Cannabinoid 50 Ng, Ur [place] NONE DETECTED NONE DETECTED   Barbiturates, Ur Screen NONE DETECTED NONE DETECTED   Benzodiazepine, Ur Scrn NONE DETECTED NONE DETECTED   Methadone Scn, Ur NONE DETECTED NONE DETECTED   Group B Strep negative  Assessment   G2P1001 at [redacted]w[redacted]d Estimated Date of Delivery: 08/30/22  The fetus is reassuring.   Patient Active Problem List   Diagnosis Date Noted   Labor and delivery, indication for care 08/19/2022   Marginal insertion of umbilical cord affecting management of mother 04/08/2022   Obesity affecting pregnancy 04/08/2022   Supervision of other normal pregnancy, antepartum 01/13/2022    Plan  1. Admitted to L&D :   2. EFM:-- Category 1 3. IV pain medications or Epidural if desired.   4. Admission labs completed 5. Dr. Logan Bores notified of admisison 6. Anticipate NSVD  Doreene Burke, CNM  08/23/2022 9:17 PM

## 2022-08-24 ENCOUNTER — Encounter: Payer: Self-pay | Admitting: Certified Nurse Midwife

## 2022-08-24 DIAGNOSIS — O43193 Other malformation of placenta, third trimester: Secondary | ICD-10-CM

## 2022-08-24 DIAGNOSIS — Z3A39 39 weeks gestation of pregnancy: Secondary | ICD-10-CM

## 2022-08-24 LAB — RPR: RPR Ser Ql: NONREACTIVE

## 2022-08-24 MED ORDER — AMMONIA AROMATIC IN INHA
RESPIRATORY_TRACT | Status: AC
  Filled 2022-08-24: qty 10

## 2022-08-24 MED ORDER — OXYCODONE-ACETAMINOPHEN 5-325 MG PO TABS: 2.0000 | ORAL_TABLET | ORAL | Status: DC | PRN

## 2022-08-24 MED ORDER — MISOPROSTOL 200 MCG PO TABS
ORAL_TABLET | ORAL | Status: AC
  Filled 2022-08-24: qty 4

## 2022-08-24 MED ORDER — LIDOCAINE HCL (PF) 1 % IJ SOLN
INTRAMUSCULAR | Status: AC
  Filled 2022-08-24: qty 30

## 2022-08-24 MED ORDER — SIMETHICONE 80 MG PO CHEW: 80.0000 mg | CHEWABLE_TABLET | ORAL | Status: DC | PRN

## 2022-08-24 MED ORDER — IBUPROFEN 600 MG PO TABS
600.0000 mg | ORAL_TABLET | Freq: Four times a day (QID) | ORAL | Status: DC
  Administered 2022-08-24 – 2022-08-25 (×4): 600 mg via ORAL
  Filled 2022-08-24 (×4): qty 1

## 2022-08-24 MED ORDER — DOCUSATE SODIUM 100 MG PO CAPS
100.0000 mg | ORAL_CAPSULE | Freq: Two times a day (BID) | ORAL | Status: DC
  Administered 2022-08-25: 100 mg via ORAL
  Filled 2022-08-24: qty 1

## 2022-08-24 MED ORDER — DIBUCAINE (PERIANAL) 1 % EX OINT: 1.0000 | TOPICAL_OINTMENT | CUTANEOUS | Status: DC | PRN

## 2022-08-24 MED ORDER — PRENATAL MULTIVITAMIN CH
1.0000 | ORAL_TABLET | Freq: Every day | ORAL | Status: DC
  Administered 2022-08-24 – 2022-08-25 (×2): 1 via ORAL
  Filled 2022-08-24 (×2): qty 1

## 2022-08-24 MED ORDER — WITCH HAZEL-GLYCERIN EX PADS: 1.0000 | MEDICATED_PAD | CUTANEOUS | Status: DC | PRN

## 2022-08-24 MED ORDER — ONDANSETRON HCL 4 MG/2ML IJ SOLN: 4.0000 mg | INTRAMUSCULAR | Status: DC | PRN

## 2022-08-24 MED ORDER — SENNOSIDES-DOCUSATE SODIUM 8.6-50 MG PO TABS
2.0000 | ORAL_TABLET | ORAL | Status: DC
  Administered 2022-08-24 – 2022-08-25 (×2): 2 via ORAL
  Filled 2022-08-24 (×2): qty 2

## 2022-08-24 MED ORDER — METHYLERGONOVINE MALEATE 0.2 MG/ML IJ SOLN: 0.2000 mg | INTRAMUSCULAR | Status: DC | PRN

## 2022-08-24 MED ORDER — METHYLERGONOVINE MALEATE 0.2 MG PO TABS: 0.2000 mg | ORAL_TABLET | ORAL | Status: DC | PRN

## 2022-08-24 MED ORDER — ONDANSETRON HCL 4 MG PO TABS: 4.0000 mg | ORAL_TABLET | ORAL | Status: DC | PRN

## 2022-08-24 MED ORDER — OXYCODONE-ACETAMINOPHEN 5-325 MG PO TABS: 1.0000 | ORAL_TABLET | ORAL | Status: DC | PRN

## 2022-08-24 MED ORDER — FERROUS SULFATE 325 (65 FE) MG PO TABS
325.0000 mg | ORAL_TABLET | Freq: Every day | ORAL | Status: DC
  Administered 2022-08-24 – 2022-08-25 (×2): 325 mg via ORAL
  Filled 2022-08-24 (×2): qty 1

## 2022-08-24 MED ORDER — COCONUT OIL OIL: 1.0000 | TOPICAL_OIL | Status: DC | PRN

## 2022-08-24 MED ORDER — BENZOCAINE-MENTHOL 20-0.5 % EX AERO
1.0000 | INHALATION_SPRAY | CUTANEOUS | Status: DC | PRN
  Administered 2022-08-24: 1 via TOPICAL
  Filled 2022-08-24: qty 56

## 2022-08-24 MED ORDER — OXYTOCIN 10 UNIT/ML IJ SOLN
INTRAMUSCULAR | Status: AC
  Filled 2022-08-24: qty 2

## 2022-08-24 NOTE — Discharge Summary (Signed)
Postpartum Discharge Summary  Date of Service updated:08/25/2022     Patient Name: Alexandra Olson DOB: 1991-10-09 MRN: [id number]  Date of admission: 08/23/2022 Delivery date:08/24/2022  Delivering provider: Philip Aspen  Date of discharge: 08/25/2022  Admitting diagnosis: Labor and delivery, indication for care [O75.9] Intrauterine pregnancy: [redacted]w[redacted]d    Secondary diagnosis:  Active Problems:   Marginal insertion of umbilical cord affecting management of mother   Labor and delivery, indication for care   Postpartum care following vaginal delivery   Encounter for care or examination of lactating mother  Additional problems: Marginal cord insertion    Discharge diagnosis: Term Pregnancy Delivered                                              Post partum procedures: none Augmentation: AROM and Pitocin Complications: None  Hospital course: Onset of Labor With Vaginal Delivery      31y.o. yo G2P1001 at 384w1das admitted in Latent Labor on 08/23/2022. Labor course was complicated by marginal cord insertion.  Membrane Rupture Time/Date: 9:13 PM ,08/23/2022   Delivery Method:Vaginal, Spontaneous  Episiotomy: None  Lacerations:  None right labial abrasion, hemostatic no repair needed Patient had a postpartum course complicated by NA.  She is tolerating regular diet, her pain is controlled with PO medication, she is ambulating and voiding without difficulty. Patient is discharged home in stable condition on 08/25/22.  Newborn Data: Birth date:08/24/2022  Birth time:3:46 AM  Gender:Female  Living status:Living  Apgars:9 ,9  Weight:3680 g   Magnesium Sulfate received: No BMZ received: No Rhophylac:N/A MMR:No T-DaP:Given prenatally Flu: No Transfusion:No  Physical exam  Vitals:   08/24/22 1630 08/24/22 1940 08/25/22 0140 08/25/22 0757  BP: 117/84 131/83 (!) 143/89 (!) 132/98  Pulse: 88 98 81 85  Resp: _0 Temp: 98.4 F (36.9 C) 98.4 F (36.9 C) 98.1 F  (36.7 C) 98.3 F (36.8 C)  TempSrc:  Oral Oral Oral  SpO2:  96%  95%  Weight:      Height:       General: alert, cooperative, and no distress Lochia: appropriate Uterine Fundus: firm Incision: N/A DVT Evaluation: No evidence of DVT seen on physical exam. Labs: Lab Results  Component Value Date   WBC 13.2 (H) 08/25/2022   HGB 10.6 (L) 08/25/2022   HCT 32.8 (L) 08/25/2022   MCV 90.4 08/25/2022   PLT 203 08/25/2022       No data to display         Edinburgh Score:    08/24/2022    1:55 PM  Edinburgh Postnatal Depression Scale Screening Tool  I have been able to laugh and see the funny side of things. 0  I have looked forward with enjoyment to things. 0  I have blamed myself unnecessarily when things went wrong. 0  I have been anxious or worried for no good reason. 1  I have felt scared or panicky for no good reason. 0  Things have been getting on top of me. 1  I have been so unhappy that I have had difficulty sleeping. 1  I have felt sad or miserable. 0  I have been so unhappy that I have been crying. 0  The thought of harming myself has occurred to me. 0  Edinburgh Postnatal Depression Scale Total 3  After visit meds:  Allergies as of 08/25/2022       Reactions   Zoloft [sertraline Hcl] Other (See Comments)   Extreme migraines Extreme migraines        Medication List     TAKE these medications    cetirizine 10 MG tablet Commonly known as: ZYRTEC Take 10 mg by mouth daily.   PRENATAL VITAMIN PO Take by mouth.         Discharge home in stable condition Infant Feeding: Breast Infant Disposition:home with mother Discharge instruction: per After Visit Summary and Postpartum booklet. Activity: Advance as tolerated. Pelvic rest for 6 weeks.  Diet: routine diet Anticipated Birth Control:  Mirena IUD Postpartum Appointment:2 weeks, 6 weeks Additional Postpartum F/U:  PRN Future Appointments: Future Appointments  Date Time Provider  [place]  08/27/2022  9:15 AM AOB-NST ROOM AOB-AOB None  08/27/2022  9:45 AM Lurlean Horns, CNM AOB-AOB None   Follow up Visit:  Follow-up Information     Philip Aspen, CNM. Schedule an appointment as soon as possible for a visit in 2 week(s).   Specialties: Certified Nurse Midwife, Radiology Why: 2 weeks telephone visit and 6 weeks postpartum follow up/Mirena IUD insertion Contact information: [place] [address] [place], [facility] 08/25/2022, 9:44 AM

## 2022-08-24 NOTE — Progress Notes (Signed)
LABOR NOTE   Alexandra Olson 31 y.o.GP@ at [redacted]w[redacted]d  SUBJECTIVE:  Feeling more pressure Analgesia: Labor support without medications  OBJECTIVE:  BP 119/65   Pulse (!) 109   Temp 97.8 F (36.6 C) (Oral)   Resp 18   Ht 5\' 7"  (1.702 m)   Wt 113.5 kg   LMP 11/16/2021 (Exact Date)   SpO2 98%   BMI 39.19 kg/m  No intake/output data recorded.  She has shown cervical change. CERVIX: 9.5:  100%:   -1:   anterior:   soft SVE:   Dilation: Lip/rim Effacement (%): 100 Station: -1 Exam by:: Philip Aspen CNM CONTRACTIONS: regular, every 2-3 minutes FHR: Fetal heart tracing reviewed. Baseline: 150 bpm, Variability: Good {> 6 bpm), Accelerations: Reactive, and Decelerations: variable with pushing  Category II    Labs: Lab Results  Component Value Date   WBC 11.0 (H) 08/23/2022   HGB 12.2 08/23/2022   HCT 38.0 08/23/2022   MCV 88.8 08/23/2022   PLT 284 08/23/2022    ASSESSMENT: 1) Labor curve reviewed.       Progress: Active phase labor.     Membranes: ruptured, clear fluid, bloody show present     Active Problems:   Labor and delivery, indication for care   PLAN: IV Pitocin augmentation Attempt to reduce cervix with pushing, unable to reduce after pushing with 3 ctx. Pt to left lateral , will re evaluate cervix 30 min.   Philip Aspen, CNM  08/24/2022 1:10 AM

## 2022-08-24 NOTE — Anesthesia Postprocedure Evaluation (Signed)
Anesthesia Post Note  Patient: [name]  Procedure(s) Performed: AN AD HOC LABOR EPIDURAL  Patient location during evaluation: Mother Baby Anesthesia Type: Epidural Level of consciousness: awake and alert, oriented and patient cooperative Pain management: pain level controlled Vital Signs Assessment: post-procedure vital signs reviewed and stable Respiratory status: spontaneous breathing, nonlabored ventilation and respiratory function stable Cardiovascular status: blood pressure returned to baseline and stable Postop Assessment: no headache, adequate PO intake, no backache, able to ambulate and no apparent nausea or vomiting Anesthetic complications: no   No notable events documented.   Last Vitals:  Vitals:   08/24/22 0638 08/24/22 0733  BP: 97/66 123/71  Pulse: 93 (!) 106  Resp: 18 20  Temp: 36.9 C 36.9 C  SpO2: 94% 98%    Last Pain:  Vitals:   08/24/22 0733  TempSrc:   PainSc: [place]

## 2022-08-24 NOTE — Lactation Note (Signed)
This note was copied from a baby's chart. Lactation Consultation Note  Patient Name: Alexandra Olson Today's Date: 08/24/2022 Reason for consult: Initial assessment;Term;Breastfeeding assistance Age:29 hours  Maternal Data Has patient been taught Hand Expression?: Yes Does the patient have breastfeeding experience prior to this delivery?: No (did not BF her 29yr old) P2, SVD 11 hours ago.  First time breastfeeding.  Feeding Mother's Current Feeding Choice: Breast Milk Baby has been on/off the breast, crying and at times angry. Upon entry mom was trying to BF in cradle position on L breast. States that baby does fine on R. LC notes mom using elbow to bring baby in, and her free hand to push against the breast instead of sandwiching. LATCH Score Latch: Repeated attempts needed to sustain latch, nipple held in mouth throughout feeding, stimulation needed to elicit sucking reflex.  Audible Swallowing: A few with stimulation  Type of Nipple: Everted at rest and after stimulation  Comfort (Breast/Nipple): Soft / non-tender  Hold (Positioning): Assistance needed to correctly position infant at breast and maintain latch.  LATCH Score: 7  LC assisted with latching by sandwiching the breast tissue, baby would latch with strong pulls however would let go as soon as sandwich hold was released. LC offered to change positions for better acceptance. After a few more minutes of trying mom did reposition to a representation of football hold, without support pillows; LC attempted to modify position, but again baby would not sustain latch.  Lactation Tools Discussed/Used    Interventions Interventions: Breast feeding basics reviewed;Assisted with latch;Hand express;Adjust position;Support pillows;Position options;Education (Attempted education given for position/alignment, sandwiching of tissue to achieve and sustain latch, support pillows to bring baby to breast level, different positions,  feeding plan)  LC talked with mom about tips for breast acceptance from both breasts- positional changes, attempts on L breast more often, feeding from R and pumping L.  Mom verbalized understanding.  Discharge Pump: Personal  Consult Status Consult Status: Follow-up    Alexandra Olson 08/24/2022, 3:14 PM

## 2022-08-25 LAB — CBC
HCT: 32.8 % — ABNORMAL LOW (ref 36.0–46.0)
Hemoglobin: 10.6 g/dL — ABNORMAL LOW (ref 12.0–15.0)
MCH: 29.2 pg (ref 26.0–34.0)
MCHC: 32.3 g/dL (ref 30.0–36.0)
MCV: 90.4 fL (ref 80.0–100.0)
Platelets: 203 10*3/uL (ref 150–400)
RBC: 3.63 MIL/uL — ABNORMAL LOW (ref 3.87–5.11)
RDW: 14.3 % (ref 11.5–15.5)
WBC: 13.2 10*3/uL — ABNORMAL HIGH (ref 4.0–10.5)
nRBC: 0 % (ref 0.0–0.2)

## 2022-08-25 MED ORDER — IBUPROFEN 600 MG PO TABS
600.0000 mg | ORAL_TABLET | Freq: Four times a day (QID) | ORAL | Status: DC
  Administered 2022-08-25: 600 mg via ORAL
  Filled 2022-08-25: qty 1

## 2022-08-25 NOTE — Progress Notes (Signed)
Patient responsive to teaching. Has no questions about teaching at this time. Wheeled out by Psychologist, occupational.

## 2022-08-26 LAB — SURGICAL PATHOLOGY

## 2022-08-27 ENCOUNTER — Other Ambulatory Visit: Payer: Medicaid Other

## 2022-08-27 ENCOUNTER — Encounter: Payer: Medicaid Other | Admitting: Obstetrics

## 2022-09-09 ENCOUNTER — Telehealth: Payer: Self-pay

## 2022-09-09 ENCOUNTER — Encounter: Payer: Self-pay | Admitting: Certified Nurse Midwife

## 2022-09-09 ENCOUNTER — Telehealth (INDEPENDENT_AMBULATORY_CARE_PROVIDER_SITE_OTHER): Payer: Medicaid Other | Admitting: Certified Nurse Midwife

## 2022-09-09 DIAGNOSIS — Z1331 Encounter for screening for depression: Secondary | ICD-10-CM

## 2022-09-09 DIAGNOSIS — Z1332 Encounter for screening for maternal depression: Secondary | ICD-10-CM | POA: Diagnosis not present

## 2022-09-09 NOTE — Telephone Encounter (Signed)
Alexandra Olson request that patient be contacted to schedule a 6 week post partum visit in 4 weeks. Thanks. KW

## 2022-09-09 NOTE — Progress Notes (Signed)
Virtual Visit via Video Note  I connected with Alexandra Olson on 09/09/22 at  3:35 PM EST by a video enabled telemedicine application and verified that I am speaking with the correct person using two identifiers.  Location: Patient: at home Provider: at office   I discussed the limitations of evaluation and management by telemedicine and the availability of in person appointments. The patient expressed understanding and agreed to proceed.  History of Present Illness: 2 wk status post SVD 39.1 wks    Observations/Objective: Denies any issues . She is breastfeeding and that is going well. Her bleeding and decreased to spotting , she is not having any pain. She feels like her mood is good and has support at home.   Assessment and Plan: EDPNS 3   Follow Up :  4 wks for in office visit.     I discussed the assessment and treatment plan with the patient. The patient was provided an opportunity to ask questions and all were answered. The patient agreed with the plan and demonstrated an understanding of the instructions.   The patient was advised to call back or seek an in-person evaluation if the symptoms worsen or if the condition fails to improve as anticipated.  I provided 8 minutes of non-face-to-face time during this encounter.   Philip Aspen, CNM

## 2022-09-09 NOTE — Progress Notes (Signed)
Edinburgh Postnatal Depression Scale - 09/09/22 1550       Edinburgh Postnatal Depression Scale:  In the Past 7 Days   I have been able to laugh and see the funny side of things. 0    I have looked forward with enjoyment to things. 0    I have blamed myself unnecessarily when things went wrong. 0    I have been anxious or worried for no good reason. 1    I have felt scared or panicky for no good reason. 0    Things have been getting on top of me. 1    I have been so unhappy that I have had difficulty sleeping. 0    I have felt sad or miserable. 1    I have been so unhappy that I have been crying. 0    The thought of harming myself has occurred to me. 0    Edinburgh Postnatal Depression Scale Total 3

## 2022-10-21 ENCOUNTER — Ambulatory Visit (INDEPENDENT_AMBULATORY_CARE_PROVIDER_SITE_OTHER): Payer: Medicaid Other | Admitting: Certified Nurse Midwife

## 2022-10-21 ENCOUNTER — Encounter: Payer: Self-pay | Admitting: Certified Nurse Midwife

## 2022-10-21 DIAGNOSIS — Z30014 Encounter for initial prescription of intrauterine contraceptive device: Secondary | ICD-10-CM

## 2022-10-21 DIAGNOSIS — Z3202 Encounter for pregnancy test, result negative: Secondary | ICD-10-CM

## 2022-10-21 LAB — POCT URINE PREGNANCY: Preg Test, Ur: NEGATIVE

## 2022-10-21 NOTE — Progress Notes (Signed)
Subjective:    Alexandra Olson is a 31 y.o. [id number] Caucasian female who presents for a postpartum visit. She is 6 weeks postpartum following a spontaneous vaginal delivery at 39.1 gestational weeks. Anesthesia: epidural. I have fully reviewed the prenatal and intrapartum course. Postpartum course has been normal. Baby's course has been normal. Baby is feeding by breast. Bleeding no bleeding. Bowel function is normal. Bladder function is normal. Patient is not sexually active. Last sexual activity: prior to delivery. Contraception method is  planning IUD  . Postpartum depression screening: negative. Score 3.  Last pap 01/13/2022 and was negative.  The following portions of the patient's history were reviewed and updated as appropriate: allergies, current medications, past medical history, past surgical history and problem list.  Review of Systems Pertinent items are noted in HPI.   There were no vitals filed for this visit. No LMP recorded.  Objective:   General:  alert, cooperative and no distress   Breasts:  deferred, no complaints  Lungs: clear to auscultation bilaterally  Heart:  regular rate and rhythm  Abdomen: soft, nontender   Vulva: normal  Vagina: normal vagina  Cervix:  closed  Corpus: Well-involuted  Adnexa:  Non-palpable  Rectal Exam: no hemorrhoids        Assessment:   Postpartum exam 6 wks s/p SVD Breastfeeding Depression screening Contraception counseling   Plan:  :  planning IUD Follow up in: 4 weeks or earlier if needed  Doreene Burke, CNM

## 2022-10-21 NOTE — Progress Notes (Signed)
  GYNECOLOGY OFFICE PROCEDURE NOTE  Alexandra Olson is a 31 y.o. [id number] here for Mirena IUD insertion. No GYN concerns.  Last pap smear was on 01/13/2022 and was normal.  IUD Insertion Procedure Note Patient identified, informed consent performed, consent signed.   Discussed risks of irregular bleeding, cramping, infection, malpositioning or misplacement of the IUD outside the uterus which may require further procedure such as laparoscopy. Also discussed >99% contraception efficacy, increased risk of ectopic pregnancy with failure of method.   Emphasized that this did not protect against STIs, condoms recommended during all sexual encounters. Time out was performed.  Urine pregnancy test negative.  Speculum placed in the vagina.  Cervix visualized.  Cleaned with Betadine x 2.  Grasped anteriorly with a single tooth tenaculum.  Uterus sounded to 10 cm.  Mirena IUD placed per manufacturer's recommendations.  Strings trimmed to 3 cm. Tenaculum was removed, good hemostasis noted.  Patient tolerated procedure well.   Patient was given post-procedure instructions.  She was advised to have backup contraception for one week.  Patient was also asked to check IUD strings periodically and follow up in 4 weeks for IUD check.   Doreene Burke, CNM

## 2022-10-21 NOTE — Patient Instructions (Signed)

## 2022-10-22 ENCOUNTER — Encounter: Payer: Self-pay | Admitting: Certified Nurse Midwife

## 2022-11-17 ENCOUNTER — Ambulatory Visit (INDEPENDENT_AMBULATORY_CARE_PROVIDER_SITE_OTHER): Payer: Medicaid Other | Admitting: Certified Nurse Midwife

## 2022-11-17 ENCOUNTER — Encounter: Payer: Self-pay | Admitting: Certified Nurse Midwife

## 2022-11-17 VITALS — BP 125/87 | HR 69 | Ht 67.0 in | Wt 223.6 lb

## 2022-11-17 DIAGNOSIS — Z30431 Encounter for routine checking of intrauterine contraceptive device: Secondary | ICD-10-CM | POA: Diagnosis not present

## 2022-11-17 DIAGNOSIS — O906 Postpartum mood disturbance: Secondary | ICD-10-CM

## 2022-11-17 MED ORDER — BUPROPION HCL ER (SR) 100 MG PO TB12: 100.0000 mg | ORAL_TABLET | Freq: Two times a day (BID) | ORAL | 1 refills | Status: DC

## 2022-11-17 NOTE — Progress Notes (Signed)
    GYNECOLOGY OFFICE ENCOUNTER NOTE  History:  32 y.o. [id number] here today for today for IUD string check; Mirena  IUD was placed  10/21/2022. Pt complains of bleeding and mood changes with IUD in place.   The following portions of the patient's history were reviewed and updated as appropriate: allergies, current medications, past family history, past medical history, past social history, past surgical history and problem list.  Pertinent items are noted in HPI.   Objective:  Physical Exam Blood pressure 125/87, pulse 69, height 5\' 7"  (1.702 m), weight 223 lb 9.6 oz (101.4 kg), currently breastfeeding. CONSTITUTIONAL: Well-developed, well-nourished female in no acute distress.  NEUROLOGIC: Alert and oriented to person, place, and time. Normal reflexes, muscle tone coordination.  PSYCHIATRIC: Normal mood and affect. Normal behavior. Normal judgment and thought content. CARDIOVASCULAR: Normal heart rate noted RESPIRATORY: Effort and breath sounds normal, no problems with respiration noted ABDOMEN: Soft, no distention noted.   PELVIC: Normal appearing external genitalia; normal appearing vaginal mucosa and cervix.  IUD strings not visualized.    Edinburgh Postnatal Depression Scale - 11/17/22 1050       Edinburgh Postnatal Depression Scale:  In the Past 7 Days   I have been able to laugh and see the funny side of things. 1    I have looked forward with enjoyment to things. 2    I have blamed myself unnecessarily when things went wrong. 3    I have been anxious or worried for no good reason. 2    I have felt scared or panicky for no good reason. 1    Things have been getting on top of me. 2    I have been so unhappy that I have had difficulty sleeping. 2    I have felt sad or miserable. 2    I have been so unhappy that I have been crying. 3    The thought of harming myself has occurred to me. 0    Edinburgh Postnatal Depression Scale Total 18              Assessment & Plan:   Orders placed for u/s to evaluate placement.  Pt state she feels angry all the time and does not like herself. She is interested in using medication at this time. Wellbutrin 100 mg ordered will follow up in 4 wks for medication check .   Philip Aspen, CNM

## 2022-12-04 ENCOUNTER — Ambulatory Visit
Admission: RE | Admit: 2022-12-04 | Discharge: 2022-12-04 | Disposition: A | Discharge status: Home / Self Care | Payer: Medicaid Other | Source: Ambulatory Visit | Attending: Certified Nurse Midwife | Admitting: Certified Nurse Midwife

## 2022-12-04 DIAGNOSIS — Z30431 Encounter for routine checking of intrauterine contraceptive device: Secondary | ICD-10-CM | POA: Diagnosis present

## 2022-12-05 ENCOUNTER — Encounter: Payer: Self-pay | Admitting: Certified Nurse Midwife

## 2022-12-05 ENCOUNTER — Ambulatory Visit (INDEPENDENT_AMBULATORY_CARE_PROVIDER_SITE_OTHER): Payer: Medicaid Other | Admitting: Certified Nurse Midwife

## 2022-12-05 VITALS — BP 117/83 | HR 85 | Wt 218.2 lb

## 2022-12-05 DIAGNOSIS — F53 Postpartum depression: Secondary | ICD-10-CM

## 2022-12-05 DIAGNOSIS — R4589 Other symptoms and signs involving emotional state: Secondary | ICD-10-CM

## 2022-12-05 DIAGNOSIS — T8332XD Displacement of intrauterine contraceptive device, subsequent encounter: Secondary | ICD-10-CM | POA: Diagnosis not present

## 2022-12-05 MED ORDER — VENLAFAXINE HCL ER 37.5 MG PO CP24: 37.5000 mg | ORAL_CAPSULE | Freq: Every day | ORAL | 1 refills | Status: DC

## 2022-12-05 NOTE — Patient Instructions (Signed)
Major Depressive Disorder, Adult Major depressive disorder (MDD) is a mental health condition. People with this disorder feel very sad, hopeless, and lose interest in things. Symptoms last most of the day, almost every day, for 2 weeks. MDD can affect: Relationships. Work and school. Things you usually like to do. What are the causes? The cause of MDD is not known. What increases the risk? Having family members with depression. Being female. Family problems. Alcohol or drug misuse. A lot of stress in your life, such as from: Living without basic needs such as food and housing. Being treated poorly because of race, sex, or religion (discrimination). Things that caused you pain as a child, especially if you lost a parent or were abused. Health and mental problems that you have had for a long time. What are the signs or symptoms? The main symptoms of this condition are: Being sad all the time. Being grouchy (irritable) all the time. Not enjoying the things you usually like. Sleeping too much or too little. Eating too much or too little. Feeling tired. Other symptoms include: Gaining or losing weight, without knowing why. Being restless and weak. Feeling hopeless, worthless, or guilty. Trouble thinking or making decisions. Thoughts of hurting yourself or others, or thoughts of ending your life. Spending a lot of time alone. Being unable to do daily tasks. If you have very bad MDD, you may: Believe things that are not true. Hear, see, taste, or feel things that are not there. Have mild depression that lasts for at least 2 years. Feel very sad and hopeless. Have trouble speaking or moving. Feel very sad during some seasons. How is this treated? Talk therapy. This teaches you about thoughts, feelings, and actions and how to change them. This can also help you to talk with others. This can be done with members of your family. Medicines. Lifestyle changes. You may need to: Limit  alcohol use. Stop using drugs, if you use them. Exercise. Get plenty of sleep. Eat healthy. Spend more time outdoors. Brain stimulation. This may be done when symptoms are very bad or have not gotten better. Follow these instructions at home: Alcohol use Do not drink alcohol if: Your health care provider tells you not to drink. You are pregnant, may be pregnant, or are planning to become pregnant. If you drink alcohol: Limit how much you use to: 0-1 drink a day for women. 0-2 drinks a day for men. Know how much alcohol is in your drink. In the [place] one drink equals one 12 oz bottle of beer (355 mL), one 5 oz glass of wine (148 mL), or one 1 oz glass of hard liquor (44 mL). Activity Exercise as told by your doctor. Spend time outdoors. Make time to do the things you enjoy. Find ways to deal with stress. Try to: Meditate. Do deep breathing. Spend time in nature. Keep a journal. Return to your normal activities when your doctor says that it is safe. General instructions  Take over-the-counter and prescription medicines only as told by your doctor. Talk to your doctor about: Alcohol use. It can affect medicines. Any drug use. Eat healthy foods. Get a lot of sleep. Think about joining a support group. Ask your doctor about that. Keep all follow-up visits. Your doctor will need to check on your mood, behavior, and medicines, and change your treatment as needed. Where to find more information: National Alliance on Mental Illness: nami.org National Institute of Mental Health: nimh.nih.gov American Psychiatric Association: psychiatry.org Contact a doctor   if: You feel worse. You get new symptoms. Get help right away if: You hurt yourself on purpose (self-harm). You have thoughts about hurting yourself or others. You see, hear, taste, smell, or feel things that are not there. Get help right away if you feel like you may hurt yourself or others, or have thoughts about taking  your own life. Go to your nearest emergency room or: Call 911. Call the National Suicide Prevention Lifeline at 1-800-273-8255 or 988. This is open 24 hours a day. Text the Crisis Text Line at 741741. This information is not intended to replace advice given to you by your health care provider. Make sure you discuss any questions you have with your health care provider. Document Revised: 02/25/2022 Document Reviewed: 02/25/2022 Elsevier Patient Education  2023 Elsevier Inc.  

## 2022-12-05 NOTE — Progress Notes (Signed)
Medication Management Clinic Visit Note  Patient: NUSAYBA CADENAS MRN: [id number] Date of Birth: 07/11/1991 PCP: [place] 32 y.o. female presents for a medication visit today. She was started on Wellbutrin 100 mg 4 weeks ago. PT states she kept waiting to feel better but she did not , which in turn made her feel worse. She is tearful today. She states she has had thoughts of feeling "everyone would be better off is she was not around". She stopped the medication 4 days ago. She states she does not want to do but becomes over whelmed and has panic attacks, gets short of breath. She denies having a plan in place.   IUD- lost strings. Pt had [place] that shows not definitively seen    BP 117/83   Pulse 85   Wt 218 lb 3.2 oz (99 kg)   BMI 34.17 kg/m   Patient Information   Past Medical History:  Diagnosis Date   GERD (gastroesophageal reflux disease)       Past Surgical History:  Procedure Laterality Date   WISDOM TOOTH EXTRACTION  12/2015     Family History  Problem Relation Age of Onset   Diabetes Maternal Grandfather    Diabetes Paternal Grandfather    Breast cancer Maternal Aunt     New Diagnoses (since last visit):   Family Support: husband , works    Social History   Substance and Sexual Activity  Alcohol Use Not Currently   Comment: every once in a while      Social History   Tobacco Use  Smoking Status Former   Packs/day: 0.20   Types: Cigarettes  Smokeless Tobacco Never      Health Maintenance  Topic Date Due   COVID-19 Vaccine (1) Never done   Hepatitis C Screening  Never done   INFLUENZA VACCINE  Never done   PAP-Cervical Cytology Screening  01/13/2025   PAP SMEAR-Modifier  01/13/2025   DTaP/Tdap/Td (2 - Td or Tdap) 06/17/2032   HIV Screening  Completed   HPV VACCINES  Aged Out    Narrative & Impression  CLINICAL DATA:  Intrauterine device string not visualized.   EXAM: TRANSABDOMINAL AND TRANSVAGINAL  ULTRASOUND OF PELVIS   TECHNIQUE: Both transabdominal and transvaginal ultrasound examinations of the pelvis were performed. Transabdominal technique was performed for global imaging of the pelvis including uterus, ovaries, adnexal regions, and pelvic cul-de-sac. It was necessary to proceed with endovaginal exam following the transabdominal exam to visualize the endometrium.   COMPARISON:  None Available.   FINDINGS: Uterus   Measurements: 7.0 x 3.7 x 5.4 cm = volume: 73.6 mL. No fibroids or other mass visualized.   Endometrium   Thickness: 4 mm.  Intrauterine device not definitively visualized.   Right ovary   Measurements: 2.4 x 2.1 x 1.8 cm = volume: 4.7 mL. Normal appearance/no adnexal mass.   Left ovary   Measurements: 3.0 x 1.7 x 1.8 cm = volume: 4.7 mL. Normal appearance/no adnexal mass.   Other findings   No abnormal free fluid.   IMPRESSION: Intrauterine device is not definitively visualized within the endometrial cavity.   Consider further evaluation with abdominal radiograph to assess for intrauterine device.     Electronically Signed   By: Lovey Newcomer M.D.   On: 12/04/2022 12:22   Assessment and Plan:  Consult Dr. Marcelline Mates : recommends xray for IUD evaluation. Discussed use of condoms until IUD placement confirmed. Recommends SNRI, Effexor ordered 37.5  mg daily , increase to 75mg  4-7 days ( as needed) . Urgent referral to psychiatry placed.  [employer] Hot line information given. Will send in a my chart message as well. Discussed red flag symptoms and going to ED if she develops a plan to harm herself. Pt verbalizes and agrees to plan of care.   Follow up 4 wks or prn .   Face to face time 12/ consult : Dr. Marcelline Mates 7 min.   Philip Aspen, CNM

## 2022-12-08 ENCOUNTER — Other Ambulatory Visit: Payer: Medicaid Other

## 2022-12-11 ENCOUNTER — Telehealth (HOSPITAL_COMMUNITY): Payer: Self-pay | Admitting: Professional

## 2022-12-11 NOTE — Telephone Encounter (Signed)
Cln calls pt to f/u on referral from Dr. Shea Evans. Cln orients pt. Pt reports she cannot do PHP due to 68mo at home; pt states she has no one to care for the baby and she is breastfeeding. Pt reports new meds are working and hasn't had any thoughts of pSI since last week when she started new meds. Pt reports she had passive SI with anxiety and depression. Pt reports her PPD went past the 3 month mark and her OB diagnosed her with MDD. Pt also reports she thinks she has body dysmorphia. Pt reports she does want to be set up with an individual, virtual therapist. Pt reports prefers female therapist. This cln will reach out to have pt scheduled at [facility] and will call pt back with apt time once scheduled. Pt agrees that voicemail can be left if she is unable to answer when cln calls. Pt again denies SI/HI.

## 2022-12-16 ENCOUNTER — Ambulatory Visit: Payer: Medicaid Other | Admitting: Certified Nurse Midwife

## 2023-01-01 ENCOUNTER — Telehealth (INDEPENDENT_AMBULATORY_CARE_PROVIDER_SITE_OTHER): Payer: Medicaid Other | Admitting: Certified Nurse Midwife

## 2023-01-01 ENCOUNTER — Encounter: Payer: Self-pay | Admitting: Certified Nurse Midwife

## 2023-01-01 VITALS — Ht 68.0 in | Wt 216.0 lb

## 2023-01-01 DIAGNOSIS — T8332XD Displacement of intrauterine contraceptive device, subsequent encounter: Secondary | ICD-10-CM

## 2023-01-01 DIAGNOSIS — Z1332 Encounter for screening for maternal depression: Secondary | ICD-10-CM

## 2023-01-01 DIAGNOSIS — T8332XA Displacement of intrauterine contraceptive device, initial encounter: Secondary | ICD-10-CM | POA: Insufficient documentation

## 2023-01-01 DIAGNOSIS — Z1331 Encounter for screening for depression: Secondary | ICD-10-CM

## 2023-01-01 DIAGNOSIS — F53 Postpartum depression: Secondary | ICD-10-CM | POA: Diagnosis not present

## 2023-01-01 DIAGNOSIS — Z79899 Other long term (current) drug therapy: Secondary | ICD-10-CM

## 2023-01-01 HISTORY — DX: Postpartum depression: F53.0

## 2023-01-01 MED ORDER — VENLAFAXINE HCL ER 37.5 MG PO CP24: 75.0000 mg | ORAL_CAPSULE | Freq: Every day | ORAL | 5 refills | Status: DC

## 2023-01-01 NOTE — Addendum Note (Signed)
Addended by: Hildred Priest on: 01/01/2023 04:05 PM   Modules accepted: Orders

## 2023-01-01 NOTE — Progress Notes (Signed)
Virtual Visit via Video Encounter:  I connected with [name] on 01/01/23 at  3:35 PM EST by a video enabled telemedicine application and verified that I am speaking with the correct person using two identifiers.   Location: Patient: at home  Provider: at the office   Pt was started on Effexor 12/05/22 for postpartum depression. Today we are following up. Pt state she is feeling much better. Feels more like herself. She is able to have enjoyment. She is smiling and laughing during our visit today. Discussed continuation of medication x 6 months. Then re evaluating for continuations. She will reach out to me in 6 months .   Flowsheet Row Video Visit from 01/01/2023 in [facility] at [place] from 12/05/2022 in [facility] at [place] that you would be better off dead, or of hurting yourself in some way Not at all More than half the days  PHQ-9 Total Score 1 12        Lastly she was to have an x ray for full up to IUD lost strings. Pt state she has not been called to schedule. Will follow up with scheduling coordinator.   I discussed the limitations of evaluation and management by telemedicine and the availability of in person appointments. The patient expressed understanding and agreed to proceed.  I discussed the assessment and treatment plan with the patient. The patient was provided an opportunity to ask questions and all were answered. The patient agreed with the plan and demonstrated an understanding of the instructions.   The patient was advised to call back or seek an in-person evaluation if the symptoms worsen or if the condition fails to improve as anticipated.  I provided 12 minutes of non-face-to-face time during this encounter.

## 2023-01-05 ENCOUNTER — Ambulatory Visit
Admission: RE | Admit: 2023-01-05 | Discharge: 2023-01-05 | Disposition: A | Discharge status: Home / Self Care | Payer: Medicaid Other | Source: Ambulatory Visit | Attending: Certified Nurse Midwife | Admitting: Certified Nurse Midwife

## 2023-01-05 ENCOUNTER — Encounter: Payer: Self-pay | Admitting: Certified Nurse Midwife

## 2023-01-05 DIAGNOSIS — T8332XD Displacement of intrauterine contraceptive device, subsequent encounter: Secondary | ICD-10-CM | POA: Insufficient documentation

## 2023-01-05 NOTE — Progress Notes (Signed)
She could have it removed hysteroscopically, either in the office or in the OR under anesthesia

## 2023-01-06 NOTE — Progress Notes (Unsigned)
GYNECOLOGY PREOPERATIVE HISTORY AND PHYSICAL   Subjective:  Alexandra Olson is a 32 y.o. [id number] here for surgical consultation for lost IUD threads. Concern for extrauterine IUD location. Referred by Philip Aspen, CNM.  Noted IUD threads lost on routine check.  No significant preoperative concerns.    Proposed surgery: Hysteroscopic IUD retrieval, possible laparoscopy   Pertinent Gynecological History: Menses: Reports cycle 1.5 weeks ago. Regular cycles noted.  Bleeding: None Contraception: IUD Last pap: normal Date: 01/13/2022   Past Medical History:  Diagnosis Date   GERD (gastroesophageal reflux disease)    Postpartum depression 01/01/2023   Past Surgical History:  Procedure Laterality Date   WISDOM TOOTH EXTRACTION  12/2015   OB History  Gravida Para Term Preterm AB Living  '2 2 2 '$ 0 0 2  SAB IAB Ectopic Multiple Live Births  0 0 0 0 2    # Outcome Date GA Lbr Len/2nd Weight Sex Delivery Anes PTL Lv  2 Term 08/24/22 [redacted]w[redacted]d/ 01:41 8 lb 1.8 oz (3.68 kg) F Vag-Spont EPI  LIV  1 Term 05/26/15 [id number] / 00:56  M Vag-Spont EPI  LIV    Family History  Problem Relation Age of Onset   Diabetes Maternal Grandfather    Diabetes Paternal Grandfather    Breast cancer Maternal Aunt     Social History   Socioeconomic History   Marital status: Married    Spouse name: MaRodman Key Number of children: Not on file   Years of education: Not on file   Highest education level: Not on file  Occupational History   Not on file  Tobacco Use   Smoking status: Former    Packs/day: 0.20    Types: Cigarettes   Smokeless tobacco: Never  Vaping Use   Vaping Use: Former  Substance and Sexual Activity   Alcohol use: Not Currently    Comment: every once in a while   Drug use: Not Currently    Frequency: 1.0 times per week    Types: Marijuana   Sexual activity: Yes    Birth control/protection: I.U.D.    Comment: Mirena inserted on 10/21/22  Other Topics Concern   Not on  file  Social History Narrative   Not on file   Social Determinants of Health   Financial Resource Strain: Not on file  Food Insecurity: No Food Insecurity (08/23/2022)   Hunger Vital Sign    Worried About Running Out of Food in the Last Year: Never true    Ran Out of Food in the Last Year: Never true  Transportation Needs: No Transportation Needs (08/23/2022)   PRAPARE - [facility]): No    Lack of Transportation (Non-Medical): No  Physical Activity: Not on file  Stress: Not on file  Social Connections: Not on file  Intimate Partner Violence: Not on file    Current Outpatient Medications on File Prior to Visit  Medication Sig Dispense Refill   cetirizine (ZYRTEC) 10 MG tablet Take 10 mg by mouth daily.     Prenatal Vit-Fe Fumarate-FA (PRENATAL VITAMIN PO) Take by mouth.     venlafaxine XR (EFFEXOR XR) 37.5 MG 24 hr capsule Take 2 capsules (75 mg total) by mouth daily with breakfast. 60 capsule 5   No current facility-administered medications on file prior to visit.    Allergies  Allergen Reactions   Zoloft [Sertraline Hcl] Other (See Comments)    Extreme migraines Extreme migraines  Review of Systems Constitutional: No recent fever/chills/sweats Respiratory: No recent cough/bronchitis Cardiovascular: No chest pain Gastrointestinal: No recent nausea/vomiting/diarrhea Genitourinary: No UTI symptoms Hematologic/lymphatic:No history of coagulopathy or recent blood thinner use    Objective:   Blood pressure (!) 132/91, pulse 80, height '5\' 8"'$  (1.727 m), weight 212 lb 9.6 oz (96.4 kg), currently breastfeeding. CONSTITUTIONAL: Well-developed, well-nourished female in no acute distress.  HENT:  Normocephalic, atraumatic, External right and left ear normal. Oropharynx is clear and moist EYES: Conjunctivae and EOM are normal. Pupils are equal, round, and reactive to light. No scleral icterus.  NECK: Normal range of motion, supple, no  masses SKIN: Skin is warm and dry. No rash noted. Not diaphoretic. No erythema. No pallor. NEUROLOGIC: Alert and oriented to person, place, and time. Normal reflexes, muscle tone coordination. No cranial nerve deficit noted. PSYCHIATRIC: Normal mood and affect. Normal behavior. Normal judgment and thought content. CARDIOVASCULAR: Normal heart rate noted, regular rhythm RESPIRATORY: Effort and breath sounds normal, no problems with respiration noted ABDOMEN: Soft, nontender, nondistended. PELVIC: Deferred MUSCULOSKELETAL: Normal range of motion. No edema and no tenderness. 2+ distal pulses.    Labs: Lab Results  Component Value Date   WBC 13.2 (H) 08/25/2022   HGB 10.6 (L) 08/25/2022   HCT 32.8 (L) 08/25/2022   MCV 90.4 08/25/2022   PLT 203 08/25/2022      Imaging Studies: DG Abd 2 Views  Result Date: 01/05/2023 CLINICAL DATA:  IUD placement. EXAM: ABDOMEN - 2 VIEW COMPARISON:  Pelvic ultrasound 08/24/2023 FINDINGS: Lung bases are clear. Nonobstructive bowel gas pattern. IUD is situated along the left side of the pelvis. The IUD is rotated so that the stem is pointing towards the left and the arms are oriented superiorly and inferiorly. IMPRESSION: IUD is situated along the left side of the pelvis. Unusual orientation of the IUD and cannot exclude a malpositioned device. Electronically Signed   By: Markus Daft M.D.   On: 01/05/2023 11:57     CLINICAL DATA:  Intrauterine device string not visualized.   EXAM: TRANSABDOMINAL AND TRANSVAGINAL ULTRASOUND OF PELVIS   TECHNIQUE: Both transabdominal and transvaginal ultrasound examinations of the pelvis were performed. Transabdominal technique was performed for global imaging of the pelvis including uterus, ovaries, adnexal regions, and pelvic cul-de-sac. It was necessary to proceed with endovaginal exam following the transabdominal exam to visualize the endometrium.   COMPARISON:  None Available.   FINDINGS: Uterus    Measurements: 7.0 x 3.7 x 5.4 cm = volume: 73.6 mL. No fibroids or other mass visualized.   Endometrium   Thickness: 4 mm.  Intrauterine device not definitively visualized.   Right ovary   Measurements: 2.4 x 2.1 x 1.8 cm = volume: 4.7 mL. Normal appearance/no adnexal mass.   Left ovary   Measurements: 3.0 x 1.7 x 1.8 cm = volume: 4.7 mL. Normal appearance/no adnexal mass.   Other findings   No abnormal free fluid.   IMPRESSION: Intrauterine device is not definitively visualized within the endometrial cavity.   Consider further evaluation with abdominal radiograph to assess for intrauterine device.     Electronically Signed   By: Lovey Newcomer M.D.   On: 12/04/2022 12:22   Assessment:    1. Preoperative exam for gynecologic surgery   2. Intrauterine contraceptive device threads lost, subsequent encounter      Plan:   - Counseling: Procedure, risks, reasons, benefits and complications (including injury to bowel, bladder, major blood vessel, ureter, bleeding, possibility of transfusion, infection, or fistula formation)  reviewed in detail. Discussed likely possibility of IUD in abdominal cavity, however will attempt to visualize uterine cavity prior to proceeding with laparoscopy. Patient notes understanding. Routine postoperative instructions will be reviewed with the patient and her family in detail after surgery.  The patient concurred with the proposed plan, giving informed written consent for the surgery.   - Preop testing ordered. - Instructions reviewed, including NPO after midnight. - Discussed contraceptive desires, patient desires to start OCPs. BPs mildly elevated, will give prescription for Slynd.    A total of 35 minutes were spent face-to-face with the patient during this encounter and over half of that time involved counseling and coordination of care.    Rubie Maid, MD [facility]

## 2023-01-07 ENCOUNTER — Ambulatory Visit (INDEPENDENT_AMBULATORY_CARE_PROVIDER_SITE_OTHER): Payer: Medicaid Other | Admitting: Obstetrics and Gynecology

## 2023-01-07 ENCOUNTER — Encounter: Payer: Self-pay | Admitting: Obstetrics and Gynecology

## 2023-01-07 VITALS — BP 132/91 | HR 80 | Ht 68.0 in | Wt 212.6 lb

## 2023-01-07 DIAGNOSIS — T8332XD Displacement of intrauterine contraceptive device, subsequent encounter: Secondary | ICD-10-CM | POA: Diagnosis not present

## 2023-01-07 DIAGNOSIS — Z01818 Encounter for other preprocedural examination: Secondary | ICD-10-CM | POA: Diagnosis not present

## 2023-01-07 MED ORDER — SLYND 4 MG PO TABS: 1.0000 | ORAL_TABLET | Freq: Every day | ORAL | 3 refills | Status: DC

## 2023-01-08 ENCOUNTER — Other Ambulatory Visit: Payer: Self-pay | Admitting: Certified Nurse Midwife

## 2023-01-13 ENCOUNTER — Ambulatory Visit (HOSPITAL_COMMUNITY): Payer: Medicaid Other | Admitting: Mental Health

## 2023-01-13 NOTE — Progress Notes (Signed)
Pt connected for tele-assessment and reported to be not feeling well; voice noticeably hoarse. Shares would like to reschedule. Rescheduled for 02/09/23. Assessed for immediate safety concerns and educated of walk in hours for therapist.

## 2023-01-19 ENCOUNTER — Encounter
Admission: RE | Admit: 2023-01-19 | Discharge: 2023-01-19 | Disposition: A | Discharge status: Home / Self Care | Payer: Medicaid Other | Source: Ambulatory Visit | Attending: Obstetrics and Gynecology | Admitting: Obstetrics and Gynecology

## 2023-01-19 ENCOUNTER — Encounter: Payer: Self-pay | Admitting: Obstetrics and Gynecology

## 2023-01-19 ENCOUNTER — Other Ambulatory Visit: Payer: Self-pay

## 2023-01-19 DIAGNOSIS — F53 Postpartum depression: Secondary | ICD-10-CM

## 2023-01-19 NOTE — Patient Instructions (Addendum)
Your procedure is scheduled on:3/25 /2024  Report to the Registration Desk on the 1st floor of the [facility]. To find out your arrival time, please call [phone] between [place] on: 3/22/ 2024  If your arrival time is 6:00 am, do not arrive before that time as the [facility] entrance doors do not open until 6:00 am.  REMEMBER: Instructions that are not followed completely may result in serious medical risk, up to and including death; or upon the discretion of your surgeon and anesthesiologist your surgery may need to be rescheduled.  Do not eat food after midnight the night before surgery. Sips of water up to two hours before you are scheduled to arrive.  No gum chewing or hard candies.  One week prior to surgery: Stop Anti-inflammatories (NSAIDS) such as Advil, Aleve, Ibuprofen, Motrin, Naproxen, Naprosyn and Aspirin based products such as Excedrin, Goody's Powder, BC Powder. Stop ANY OVER THE COUNTER supplements until after surgery. You may however, continue to take Tylenol if needed for pain up until the day of surgery.  TAKE ONLY THESE MEDICATIONS THE MORNING OF SURGERY WITH A SIP OF WATER:  venlafaxine XR (EFFEXOR XR)    No Alcohol for 24 hours before or after surgery.  No Smoking including e-cigarettes for 24 hours before surgery.  No chewable tobacco products for at least 6 hours before surgery.  No nicotine patches on the day of surgery.  Do not use any "recreational" drugs for at least a week (preferably 2 weeks) before your surgery.  Please be advised that the combination of cocaine and anesthesia may have negative outcomes, up to and including death. If you test positive for cocaine, your surgery will be cancelled.  On the morning of surgery brush your teeth with toothpaste and water, you may rinse your mouth with mouthwash if you wish. Do not swallow any toothpaste or mouthwash.  Use CHG Soap as directed on instruction sheet.  Do not wear jewelry, make-up,  hairpins, clips or nail polish.  Do not wear lotions, powders, or perfumes.   Do not shave body hair from the neck down 48 hours before surgery.  Contact lenses, hearing aids and dentures may not be worn into surgery.  Do not bring valuables to the hospital. [facility] is not responsible for any missing/lost belongings or valuables.   Notify your doctor if there is any change in your medical condition (cold, fever, infection).  Wear comfortable clothing (specific to your surgery type) to the hospital.  After surgery, you can help prevent lung complications by doing breathing exercises.  Take deep breaths and cough every 1-2 hours. Your doctor may order a device called an Incentive Spirometer to help you take deep breaths.  If you are being admitted to the hospital overnight, leave your suitcase in the car. After surgery it may be brought to your room.    If you are being discharged the day of surgery, you will not be allowed to drive home. You will need a responsible individual to drive you home and stay with you for 24 hours after surgery.     Please call the [place] Dept. at [phone] if you have any questions about these instructions.  Surgery Visitation Policy:  Patients undergoing a surgery or procedure may have two family members or support persons with them as long as the person is not COVID-19 positive or experiencing its symptoms.   Due to an increase in RSV and influenza rates and associated hospitalizations,  children ages 66 and under will not be able to visit patients in [facility]. Masks continue to be strongly recommended.

## 2023-01-20 ENCOUNTER — Encounter
Admission: RE | Admit: 2023-01-20 | Discharge: 2023-01-20 | Disposition: A | Discharge status: Home / Self Care | Payer: Medicaid Other | Source: Ambulatory Visit | Attending: Obstetrics and Gynecology | Admitting: Obstetrics and Gynecology

## 2023-01-20 ENCOUNTER — Encounter: Payer: Self-pay | Admitting: Urgent Care

## 2023-01-20 DIAGNOSIS — Z01812 Encounter for preprocedural laboratory examination: Secondary | ICD-10-CM | POA: Diagnosis not present

## 2023-01-20 DIAGNOSIS — Z01818 Encounter for other preprocedural examination: Secondary | ICD-10-CM

## 2023-01-20 LAB — CBC
HCT: 42.8 % (ref 36.0–46.0)
Hemoglobin: 14 g/dL (ref 12.0–15.0)
MCH: 30.1 pg (ref 26.0–34.0)
MCHC: 32.7 g/dL (ref 30.0–36.0)
MCV: 92 fL (ref 80.0–100.0)
Platelets: 287 10*3/uL (ref 150–400)
RBC: 4.65 MIL/uL (ref 3.87–5.11)
RDW: 13.2 % (ref 11.5–15.5)
WBC: 8.5 10*3/uL (ref 4.0–10.5)
nRBC: 0 % (ref 0.0–0.2)

## 2023-01-26 ENCOUNTER — Other Ambulatory Visit: Payer: Self-pay

## 2023-01-26 ENCOUNTER — Encounter
Admission: RE | Disposition: A | Discharge status: Home / Self Care | Payer: Self-pay | Source: Ambulatory Visit | Attending: Obstetrics and Gynecology

## 2023-01-26 ENCOUNTER — Encounter: Payer: Self-pay | Admitting: Obstetrics and Gynecology

## 2023-01-26 ENCOUNTER — Ambulatory Visit: Payer: Medicaid Other | Admitting: Anesthesiology

## 2023-01-26 ENCOUNTER — Ambulatory Visit
Admission: RE | Admit: 2023-01-26 | Discharge: 2023-01-26 | Disposition: A | Discharge status: Home / Self Care | Payer: Medicaid Other | Source: Ambulatory Visit | Attending: Obstetrics and Gynecology | Admitting: Obstetrics and Gynecology

## 2023-01-26 DIAGNOSIS — F419 Anxiety disorder, unspecified: Secondary | ICD-10-CM | POA: Diagnosis not present

## 2023-01-26 DIAGNOSIS — Z79899 Other long term (current) drug therapy: Secondary | ICD-10-CM | POA: Diagnosis not present

## 2023-01-26 DIAGNOSIS — Y762 Prosthetic and other implants, materials and accessory obstetric and gynecological devices associated with adverse incidents: Secondary | ICD-10-CM | POA: Insufficient documentation

## 2023-01-26 DIAGNOSIS — Z6832 Body mass index (BMI) 32.0-32.9, adult: Secondary | ICD-10-CM | POA: Diagnosis not present

## 2023-01-26 DIAGNOSIS — T8332XA Displacement of intrauterine contraceptive device, initial encounter: Secondary | ICD-10-CM | POA: Insufficient documentation

## 2023-01-26 DIAGNOSIS — T8389XA Other specified complication of genitourinary prosthetic devices, implants and grafts, initial encounter: Secondary | ICD-10-CM | POA: Diagnosis present

## 2023-01-26 DIAGNOSIS — E669 Obesity, unspecified: Secondary | ICD-10-CM | POA: Insufficient documentation

## 2023-01-26 DIAGNOSIS — Z87891 Personal history of nicotine dependence: Secondary | ICD-10-CM | POA: Insufficient documentation

## 2023-01-26 DIAGNOSIS — T8332XD Displacement of intrauterine contraceptive device, subsequent encounter: Secondary | ICD-10-CM

## 2023-01-26 DIAGNOSIS — F32A Depression, unspecified: Secondary | ICD-10-CM | POA: Insufficient documentation

## 2023-01-26 DIAGNOSIS — F53 Postpartum depression: Secondary | ICD-10-CM

## 2023-01-26 DIAGNOSIS — Z3046 Encounter for surveillance of implantable subdermal contraceptive: Secondary | ICD-10-CM

## 2023-01-26 DIAGNOSIS — Z01818 Encounter for other preprocedural examination: Secondary | ICD-10-CM

## 2023-01-26 DIAGNOSIS — K219 Gastro-esophageal reflux disease without esophagitis: Secondary | ICD-10-CM | POA: Insufficient documentation

## 2023-01-26 HISTORY — PX: IUD REMOVAL: SHX5392

## 2023-01-26 HISTORY — PX: LAPAROSCOPY: SHX197

## 2023-01-26 HISTORY — PX: INSERTION OF NON VAGINAL CONTRACEPTIVE DEVICE: SHX6253

## 2023-01-26 HISTORY — DX: Anxiety disorder, unspecified: F41.9

## 2023-01-26 LAB — POCT PREGNANCY, URINE: Preg Test, Ur: NEGATIVE

## 2023-01-26 SURGERY — REMOVAL, INTRAUTERINE DEVICE
Anesthesia: General | Site: Vagina

## 2023-01-26 MED ORDER — SODIUM CHLORIDE 0.9 % IR SOLN
Status: DC | PRN
  Administered 2023-01-26: 400 mL

## 2023-01-26 MED ORDER — SUGAMMADEX SODIUM 200 MG/2ML IV SOLN
INTRAVENOUS | Status: DC | PRN
  Administered 2023-01-26: 385.6 mg via INTRAVENOUS

## 2023-01-26 MED ORDER — DROPERIDOL 2.5 MG/ML IJ SOLN: 0.6250 mg | Freq: Once | INTRAMUSCULAR | Status: DC | PRN

## 2023-01-26 MED ORDER — FAMOTIDINE 20 MG PO TABS: 20.0000 mg | ORAL_TABLET | Freq: Once | ORAL | Status: AC

## 2023-01-26 MED ORDER — BUPIVACAINE HCL (PF) 0.5 % IJ SOLN
INTRAMUSCULAR | Status: AC
  Filled 2023-01-26: qty 30

## 2023-01-26 MED ORDER — LACTATED RINGERS IV SOLN: INTRAVENOUS | Status: DC

## 2023-01-26 MED ORDER — PROPOFOL 10 MG/ML IV BOLUS
INTRAVENOUS | Status: AC
  Filled 2023-01-26: qty 20

## 2023-01-26 MED ORDER — CELECOXIB 200 MG PO CAPS: 400.0000 mg | ORAL_CAPSULE | ORAL | Status: AC

## 2023-01-26 MED ORDER — LIDOCAINE HCL (PF) 2 % IJ SOLN
INTRAMUSCULAR | Status: AC
  Filled 2023-01-26: qty 5

## 2023-01-26 MED ORDER — BUPIVACAINE HCL (PF) 0.5 % IJ SOLN
INTRAMUSCULAR | Status: DC | PRN
  Administered 2023-01-26: 18 mL

## 2023-01-26 MED ORDER — GLYCOPYRROLATE 0.2 MG/ML IJ SOLN
INTRAMUSCULAR | Status: AC
  Filled 2023-01-26: qty 1

## 2023-01-26 MED ORDER — PROMETHAZINE HCL 25 MG/ML IJ SOLN: 6.2500 mg | INTRAMUSCULAR | Status: DC | PRN

## 2023-01-26 MED ORDER — DEXMEDETOMIDINE HCL IN NACL 200 MCG/50ML IV SOLN
INTRAVENOUS | Status: DC | PRN
  Administered 2023-01-26: 12 ug via INTRAVENOUS
  Administered 2023-01-26: 4 ug via INTRAVENOUS
  Administered 2023-01-26: 8 ug via INTRAVENOUS
  Administered 2023-01-26: 4 ug via INTRAVENOUS

## 2023-01-26 MED ORDER — GABAPENTIN 300 MG PO CAPS: 300.0000 mg | ORAL_CAPSULE | ORAL | Status: AC

## 2023-01-26 MED ORDER — ROCURONIUM BROMIDE 100 MG/10ML IV SOLN
INTRAVENOUS | Status: DC | PRN
  Administered 2023-01-26: 50 mg via INTRAVENOUS

## 2023-01-26 MED ORDER — CHLORHEXIDINE GLUCONATE 0.12 % MT SOLN
OROMUCOSAL | Status: AC
  Administered 2023-01-26: 15 mL via OROMUCOSAL
  Filled 2023-01-26: qty 15

## 2023-01-26 MED ORDER — DEXAMETHASONE SODIUM PHOSPHATE 10 MG/ML IJ SOLN
INTRAMUSCULAR | Status: AC
  Filled 2023-01-26: qty 1

## 2023-01-26 MED ORDER — ACETAMINOPHEN 500 MG PO TABS: 1000.0000 mg | ORAL_TABLET | ORAL | Status: AC

## 2023-01-26 MED ORDER — ONDANSETRON HCL 4 MG/2ML IJ SOLN
INTRAMUSCULAR | Status: AC
  Filled 2023-01-26: qty 2

## 2023-01-26 MED ORDER — GABAPENTIN 300 MG PO CAPS
ORAL_CAPSULE | ORAL | Status: AC
  Administered 2023-01-26: 300 mg via ORAL
  Filled 2023-01-26: qty 1

## 2023-01-26 MED ORDER — ONDANSETRON HCL 4 MG/2ML IJ SOLN
4.0000 mg | Freq: Once | INTRAMUSCULAR | Status: AC
  Administered 2023-01-26: 4 mg via INTRAVENOUS

## 2023-01-26 MED ORDER — IBUPROFEN 800 MG PO TABS: 800.0000 mg | ORAL_TABLET | Freq: Three times a day (TID) | ORAL | 0 refills | Status: DC | PRN

## 2023-01-26 MED ORDER — ACETAMINOPHEN 500 MG PO TABS
ORAL_TABLET | ORAL | Status: AC
  Administered 2023-01-26: 1000 mg via ORAL
  Filled 2023-01-26: qty 2

## 2023-01-26 MED ORDER — CHLORHEXIDINE GLUCONATE 0.12 % MT SOLN: 15.0000 mL | Freq: Once | OROMUCOSAL | Status: AC

## 2023-01-26 MED ORDER — OXYCODONE-ACETAMINOPHEN 5-325 MG PO TABS: 1.0000 | ORAL_TABLET | Freq: Four times a day (QID) | ORAL | 0 refills | Status: DC | PRN

## 2023-01-26 MED ORDER — OXYCODONE HCL 5 MG PO TABS: 5.0000 mg | ORAL_TABLET | Freq: Once | ORAL | Status: DC | PRN

## 2023-01-26 MED ORDER — GLYCOPYRROLATE 0.2 MG/ML IJ SOLN
INTRAMUSCULAR | Status: DC | PRN
  Administered 2023-01-26: .2 mg via INTRAVENOUS

## 2023-01-26 MED ORDER — FENTANYL CITRATE (PF) 100 MCG/2ML IJ SOLN
INTRAMUSCULAR | Status: AC
  Filled 2023-01-26: qty 2

## 2023-01-26 MED ORDER — CELECOXIB 200 MG PO CAPS
ORAL_CAPSULE | ORAL | Status: AC
  Administered 2023-01-26: 400 mg via ORAL
  Filled 2023-01-26: qty 2

## 2023-01-26 MED ORDER — 0.9 % SODIUM CHLORIDE (POUR BTL) OPTIME
TOPICAL | Status: DC | PRN
  Administered 2023-01-26: 500 mL

## 2023-01-26 MED ORDER — OXYCODONE HCL 5 MG/5ML PO SOLN: 5.0000 mg | Freq: Once | ORAL | Status: DC | PRN

## 2023-01-26 MED ORDER — FAMOTIDINE 20 MG PO TABS
ORAL_TABLET | ORAL | Status: AC
  Administered 2023-01-26: 20 mg via ORAL
  Filled 2023-01-26: qty 1

## 2023-01-26 MED ORDER — ROCURONIUM BROMIDE 10 MG/ML (PF) SYRINGE
PREFILLED_SYRINGE | INTRAVENOUS | Status: AC
  Filled 2023-01-26: qty 10

## 2023-01-26 MED ORDER — MIDAZOLAM HCL 2 MG/2ML IJ SOLN
INTRAMUSCULAR | Status: DC | PRN
  Administered 2023-01-26: 2 mg via INTRAVENOUS

## 2023-01-26 MED ORDER — FENTANYL CITRATE (PF) 100 MCG/2ML IJ SOLN
INTRAMUSCULAR | Status: DC | PRN
  Administered 2023-01-26 (×2): 50 ug via INTRAVENOUS

## 2023-01-26 MED ORDER — ORAL CARE MOUTH RINSE: 15.0000 mL | Freq: Once | OROMUCOSAL | Status: AC

## 2023-01-26 MED ORDER — ACETAMINOPHEN 10 MG/ML IV SOLN: 1000.0000 mg | Freq: Once | INTRAVENOUS | Status: DC | PRN

## 2023-01-26 MED ORDER — FENTANYL CITRATE (PF) 100 MCG/2ML IJ SOLN: 25.0000 ug | INTRAMUSCULAR | Status: DC | PRN

## 2023-01-26 MED ORDER — DEXAMETHASONE SODIUM PHOSPHATE 10 MG/ML IJ SOLN
INTRAMUSCULAR | Status: DC | PRN
  Administered 2023-01-26: 10 mg via INTRAVENOUS

## 2023-01-26 MED ORDER — PROPOFOL 10 MG/ML IV BOLUS
INTRAVENOUS | Status: DC | PRN
  Administered 2023-01-26: 150 mg via INTRAVENOUS

## 2023-01-26 MED ORDER — MIDAZOLAM HCL 2 MG/2ML IJ SOLN
INTRAMUSCULAR | Status: AC
  Filled 2023-01-26: qty 2

## 2023-01-26 MED ORDER — ETONOGESTREL 68 MG ~~LOC~~ IMPL
68.0000 mg | DRUG_IMPLANT | Freq: Once | SUBCUTANEOUS | Status: AC
  Administered 2023-01-26: 68 mg via SUBCUTANEOUS
  Filled 2023-01-26 (×2): qty 1

## 2023-01-26 MED ORDER — LIDOCAINE HCL (CARDIAC) PF 100 MG/5ML IV SOSY
PREFILLED_SYRINGE | INTRAVENOUS | Status: DC | PRN
  Administered 2023-01-26: 100 mg via INTRAVENOUS

## 2023-01-26 MED ORDER — ONDANSETRON HCL 4 MG/2ML IJ SOLN
INTRAMUSCULAR | Status: DC | PRN
  Administered 2023-01-26: 4 mg via INTRAVENOUS

## 2023-01-26 SURGICAL SUPPLY — 34 items
ADH SKN CLS APL DERMABOND .7 (GAUZE/BANDAGES/DRESSINGS) ×3
APL SKNCLS STERI-STRIP NONHPOA (GAUZE/BANDAGES/DRESSINGS) ×3
BENZOIN TINCTURE PRP APPL 2/3 (GAUZE/BANDAGES/DRESSINGS) IMPLANT
BLADE SURG SZ11 CARB STEEL (BLADE) IMPLANT
BNDG CMPR 5X4 CHSV STRCH STRL (GAUZE/BANDAGES/DRESSINGS) ×3
BNDG COHESIVE 4X5 TAN STRL LF (GAUZE/BANDAGES/DRESSINGS) IMPLANT
CUP MEDICINE 2OZ PLAST GRAD ST (MISCELLANEOUS) ×3 IMPLANT
DERMABOND ADVANCED .7 DNX12 (GAUZE/BANDAGES/DRESSINGS) IMPLANT
DRAPE UNDER BUTTOCK W/FLU (DRAPES) ×3 IMPLANT
DRSG TELFA 3X8 NADH STRL (GAUZE/BANDAGES/DRESSINGS) ×3 IMPLANT
GLOVE BIO SURGEON STRL SZ 6.5 (GLOVE) ×3 IMPLANT
GLOVE INDICATOR 7.0 STRL GRN (GLOVE) ×3 IMPLANT
GOWN STRL REUS W/ TWL LRG LVL3 (GOWN DISPOSABLE) ×6 IMPLANT
GOWN STRL REUS W/TWL LRG LVL3 (GOWN DISPOSABLE) ×6
KIT TURNOVER CYSTO (KITS) ×3 IMPLANT
MANIFOLD NEPTUNE II (INSTRUMENTS) ×3 IMPLANT
Nexplanon IMPLANT
PACK DNC HYST (MISCELLANEOUS) ×3 IMPLANT
PAD OB MATERNITY 4.3X12.25 (PERSONAL CARE ITEMS) ×3 IMPLANT
PAD PREP 24X41 OB/GYN DISP (PERSONAL CARE ITEMS) ×3 IMPLANT
SCRUB CHG 4% DYNA-HEX 4OZ (MISCELLANEOUS) ×3 IMPLANT
SET CYSTO W/LG BORE CLAMP LF (SET/KITS/TRAYS/PACK) IMPLANT
SET TUBE SMOKE EVAC HIGH FLOW (TUBING) IMPLANT
SOL PREP PVP 2OZ (MISCELLANEOUS) ×3
SOLUTION PREP PVP 2OZ (MISCELLANEOUS) ×3 IMPLANT
STRIP CLOSURE SKIN 1/2X4 (GAUZE/BANDAGES/DRESSINGS) IMPLANT
SUT MNCRL 4-0 (SUTURE) ×3
SUT MNCRL 4-0 27XMFL (SUTURE) ×3
SUTURE MNCRL 4-0 27XMF (SUTURE) IMPLANT
TOWEL OR 17X26 4PK STRL BLUE (TOWEL DISPOSABLE) ×3 IMPLANT
TRAP FLUID SMOKE EVACUATOR (MISCELLANEOUS) ×3 IMPLANT
TROCAR SLEEVE LAP 5X150 (TROCAR) IMPLANT
TROCAR Z THREAD OPTICAL 5X150 (TROCAR) IMPLANT
WATER STERILE IRR 500ML POUR (IV SOLUTION) ×3 IMPLANT

## 2023-01-26 NOTE — Anesthesia Postprocedure Evaluation (Signed)
Anesthesia Post Note  Patient: [name]  Procedure(s) Performed: HYSTEROSCOPY INTRAUTERINE DEVICE (IUD) REMOVAL (Vagina ) LAPAROSCOPY OPERATIVE REMOVAL OF IUD (Abdomen) INSERTION OF NON VAGINAL CONTRACEPTIVE DEVICE (Left: Arm Upper)  Patient location during evaluation: PACU Anesthesia Type: General Level of consciousness: awake and alert, oriented and patient cooperative Pain management: pain level controlled Vital Signs Assessment: post-procedure vital signs reviewed and stable Respiratory status: spontaneous breathing, nonlabored ventilation and respiratory function stable Cardiovascular status: blood pressure returned to baseline and stable Postop Assessment: adequate PO intake Anesthetic complications: no   No notable events documented.   Last Vitals:  Vitals:   01/26/23 1115 01/26/23 1118  BP: 120/89 114/86  Pulse: 76 60  Resp: 15 18  Temp: 36.6 C (!) 36.1 C  SpO2: 97% 100%    Last Pain:  Vitals:   01/26/23 1118  TempSrc: Temporal  PainSc: 2                  Darrin Nipper

## 2023-01-26 NOTE — Transfer of Care (Signed)
Immediate Anesthesia Transfer of Care Note  Patient: [name]  Procedure(s) Performed: Procedure(s) with comments: HYSTEROSCOPY INTRAUTERINE DEVICE (IUD) REMOVAL (N/A) LAPAROSCOPY OPERATIVE REMOVAL OF IUD - removal of IUD INSERTION OF NON VAGINAL CONTRACEPTIVE DEVICE (Left)  Patient Location: PACU  Anesthesia Type:General  Level of Consciousness: sedated  Airway & Oxygen Therapy: Patient Spontanous Breathing and Patient connected to face mask oxygen  Post-op Assessment: Report given to RN and Post -op Vital signs reviewed and stable  Post vital signs: Reviewed and stable  Last Vitals:  Vitals:   01/26/23 1045 01/26/23 1050  BP: 106/68   Pulse: 92   Resp: 20   Temp: 36.5 C   SpO2: 123456 [id number]    Complications: No apparent anesthesia complications

## 2023-01-26 NOTE — Op Note (Signed)
Procedure(s): HYSTEROSCOPY INTRAUTERINE DEVICE (IUD) REMOVAL LAPAROSCOPY OPERATIVE REMOVAL OF IUD INSERTION OF NON VAGINAL CONTRACEPTIVE DEVICE Procedure Note  Alexandra Olson female 32 y.o. 01/26/2023  Indications: The patient is a 32 y.o. [id number] female with lost IUD threads, suspected IUD migration to abdominal cavity.   Pre-operative Diagnosis:  lost IUD threads, suspected IUD migration to abdominal cavity.   Post-operative Diagnosis: Same  Surgeon: Rubie Maid, MD  Assistants:  Surgical scrub tech.   Anesthesia: General endotracheal anesthesia  Findings: The uterus was sounded to 7 cm Fallopian tubes and ovaries appeared normal. Hysteroscopy with normal appearing cavity, no IUD visualized. Tubal ostia visualized bilaterally.  Laparoscopy with IUD device noted to be in posterior cul-de-sac, 1 arm entangled in omentum.  Procedure Details: The patient was seen in the Holding Room. The risks, benefits, complications, treatment options, and expected outcomes were discussed with the patient.  The patient concurred with the proposed plan, giving informed consent.  The site of surgery properly noted/marked. The patient was taken to the Operating Room, identified as [name] and the procedure verified as Procedure(s) (LRB): HYSTEROSCOPY INTRAUTERINE DEVICE (IUD) REMOVAL (N/A), LAPAROSCOPY OPERATIVE REMOVAL OF IUD INSERTION OF NON VAGINAL CONTRACEPTIVE DEVICE (Left), INSERTION OF [place] (Left).   She was then placed under general anesthesia without difficulty. She was placed in the dorsal lithotomy position, and was prepped and draped in a sterile manner.  A Time Out was held and the above information confirmed.  A straight catheterization was performed. A sterile speculum was inserted into the vagina and the cervix was grasped at the anterior lip using a single-toothed tenaculum. No IUD threads visualized at cervix. The uterus was sounded to 7 cm. Cervical dilation was  performed. A 5 mm hysteroscope was introduced into the uterus under direct visualization. The cavity was allowed to fill, and then the entire cavity was explored with the findings described above. The hysteroscope was removed as no IUD was visualized. A Hulka clamp was placed for uterine manipulation.  The speculum and tenaculum were then removed.   Attention was turned to the abdomen where an umbilical incision was made with the scalpel.  The Optiview 5-mm trocar and sleeve were then advanced without difficulty with the laparoscope under direct visualization into the abdomen.  The abdomen was then insufflated with carbon dioxide gas and adequate pneumoperitoneum was obtained. A 5-mm left lower quadrant port and an 11-mm right lower quadrant port were then placed under direct visualization.  A survey of the patient's pelvis and abdomen revealed the findings as above.  The IUD was identified in the posterior cul-de-sac, with some entanglement of one of the arms of the IUD. The IUD threads were grasped using the [place] forceps, and the IUD was elevated and the omentum was grasped and removed from the IUD arm.  Hemostasis was appreciated.  The IUD was then removed through the laparoscopic port site.  All trocars were removed under direct visualization, and the abdomen which was desufflated.  All skin incisions were closed with 4-0 Monocryl subcuticular stitches and injected with a total of 10 ml of 0.5% Sensorcaine.Dermabond was placed over the incisions.   Attention was then turned to the patient's left arm.  The arm was prepped with betadine and draped in the usual sterile fashion.  The identified insertion site was then injected with 4 ml of 1% lidocaine.  Nexplanon insertion device removed from packaging,  Device confirmed in needle, then inserted full length of needle and withdrawn per handbook instructions.  Nexplanon was able to palpated in the patient's arm. Insertion site covered with guaze and a pressure  bandage to reduce any bruising.  The patient tolerated the procedures well.    All instruments, needles, and sponge counts were correct x 2. The patient was taken to the recovery room awake, extubated and in stable condition.   An experienced assistant was required given the standard of surgical care given the complexity of the case.  This assistant was needed for exposure, dissection, suctioning, retraction, instrument exchange, and for overall help during the procedure.   Estimated Blood Loss:  minimal      Drains: straight catheterization prior to procedure with  20 ml of clear urine         Total IV Fluids:  700 ml  Specimens: None         Implants: None         Complications:  None; patient tolerated the procedure well.         Disposition: PACU - hemodynamically stable.         Condition: stable   Rubie Maid, MD [facility]

## 2023-01-26 NOTE — OR Nursing (Signed)
IUD removed and intact

## 2023-01-26 NOTE — Anesthesia Preprocedure Evaluation (Signed)
Anesthesia Evaluation  Patient identified by MRN, date of birth, ID band Patient awake    Reviewed: Allergy & Precautions, H&P , NPO status , Patient's Chart, lab work & pertinent test results  Airway Mallampati: II  TM Distance: >3 FB Neck ROM: full    Dental no notable dental hx.    Pulmonary neg pulmonary ROS, Patient abstained from smoking., former smoker   Pulmonary exam normal        Cardiovascular negative cardio ROS Normal cardiovascular exam     Neuro/Psych  PSYCHIATRIC DISORDERS Anxiety     negative neurological ROS     GI/Hepatic Neg liver ROS,GERD  ,,  Endo/Other  negative endocrine ROS    Renal/GU      Musculoskeletal   Abdominal  (+) + obese  Peds  Hematology negative hematology ROS (+)   Anesthesia Other Findings Past Medical History: No date: Anxiety No date: GERD (gastroesophageal reflux disease) 01/01/2023: Postpartum depression  Past Surgical History: 12/2015: WISDOM TOOTH EXTRACTION  BMI    Body Mass Index: 32.33 kg/m      Reproductive/Obstetrics negative OB ROS                             Anesthesia Physical Anesthesia Plan  ASA: 2  Anesthesia Plan: General LMA   Post-op Pain Management: Tylenol PO (pre-op)*, Gabapentin PO (pre-op)* and Celebrex PO (pre-op)*   Induction: Intravenous  PONV Risk Score and Plan: Dexamethasone, Ondansetron, Midazolam and Propofol infusion  Airway Management Planned: LMA  Additional Equipment:   Intra-op Plan:   Post-operative Plan: Extubation in OR  Informed Consent: I have reviewed the patients History and Physical, chart, labs and discussed the procedure including the risks, benefits and alternatives for the proposed anesthesia with the patient or authorized representative who has indicated his/her understanding and acceptance.     Dental Advisory Given  Plan Discussed with: Anesthesiologist, CRNA and  Surgeon  Anesthesia Plan Comments:        Anesthesia Quick Evaluation

## 2023-01-26 NOTE — H&P (Addendum)
GYNECOLOGY PREOPERATIVE HISTORY AND PHYSICAL   Subjective:  Alexandra Olson is a 32 y.o. [id number] here for surgical consultation for lost IUD threads. Concern for extrauterine IUD location. Referred by Philip Aspen, CNM.  Noted IUD threads lost on routine check.  No significant preoperative concerns.    Proposed surgery: Hysteroscopic IUD retrieval, possible laparoscopy   Pertinent Gynecological History: Menses: Reports cycle 1.5 weeks ago. Regular cycles noted.  Bleeding: None Contraception: IUD Last pap: normal Date: 01/13/2022   Past Medical History:  Diagnosis Date   Anxiety    GERD (gastroesophageal reflux disease)    Postpartum depression 01/01/2023   Past Surgical History:  Procedure Laterality Date   WISDOM TOOTH EXTRACTION  12/2015   OB History  Gravida Para Term Preterm AB Living  2 2 2  0 0 2  SAB IAB Ectopic Multiple Live Births  0 0 0 0 2    # Outcome Date GA Lbr Len/2nd Weight Sex Delivery Anes PTL Lv  2 Term 08/24/22 [redacted]w[redacted]d / 01:41 3680 g F Vag-Spont EPI  LIV  1 Term 05/26/15 [redacted]w[redacted]d 17:22 / 00:56  M Vag-Spont EPI  LIV    Family History  Problem Relation Age of Onset   Diabetes Maternal Grandfather    Diabetes Paternal Grandfather    Breast cancer Maternal Aunt     Social History   Socioeconomic History   Marital status: Married    Spouse name: Rodman Key   Number of children: Not on file   Years of education: Not on file   Highest education level: Not on file  Occupational History   Not on file  Tobacco Use   Smoking status: Former    Packs/day: .2    Types: Cigarettes   Smokeless tobacco: Never  Vaping Use   Vaping Use: Former  Substance and Sexual Activity   Alcohol use: Not Currently    Comment: every once in a while   Drug use: Not Currently    Frequency: 1.0 times per week    Types: Marijuana   Sexual activity: Yes    Birth control/protection: I.U.D.    Comment: Mirena inserted on 10/21/22  Other Topics Concern   Not on  file  Social History Narrative   Lives at home with husband   Social Determinants of Health   Financial Resource Strain: Not on file  Food Insecurity: No Food Insecurity (08/23/2022)   Hunger Vital Sign    Worried About Running Out of Food in the Last Year: Never true    Ran Out of Food in the Last Year: Never true  Transportation Needs: No Transportation Needs (08/23/2022)   PRAPARE - Hydrologist (Medical): No    Lack of Transportation (Non-Medical): No  Physical Activity: Not on file  Stress: Not on file  Social Connections: Not on file  Intimate Partner Violence: Not on file    No current facility-administered medications on file prior to encounter.   Current Outpatient Medications on File Prior to Encounter  Medication Sig Dispense Refill   cetirizine (ZYRTEC) 10 MG tablet Take 10 mg by mouth daily.     Drospirenone (SLYND) 4 MG TABS Take 1 tablet (4 mg total) by mouth daily. 84 tablet 3   Prenatal Vit-Fe Fumarate-FA (PRENATAL VITAMIN PO) Take by mouth.     venlafaxine XR (EFFEXOR XR) 37.5 MG 24 hr capsule Take 2 capsules (75 mg total) by mouth daily with breakfast. 60 capsule 5    Allergies  Allergen Reactions   Zoloft [Sertraline Hcl] Other (See Comments)    Extreme migraines Extreme migraines      Review of Systems Constitutional: No recent fever/chills/sweats Respiratory: No recent cough/bronchitis Cardiovascular: No chest pain Gastrointestinal: No recent nausea/vomiting/diarrhea Genitourinary: No UTI symptoms Hematologic/lymphatic:No history of coagulopathy or recent blood thinner use    Objective:   Blood pressure (!) 144/87, pulse 85, temperature 97.6 F (36.4 C), temperature source Oral, resp. rate 18, height 5\' 8"  (1.727 m), weight 96.4 kg, last menstrual period 01/25/2023, SpO2 98 %, currently breastfeeding. CONSTITUTIONAL: Well-developed, well-nourished female in no acute distress.  HENT:  Normocephalic, atraumatic,  External right and left ear normal. Oropharynx is clear and moist EYES: Conjunctivae and EOM are normal. Pupils are equal, round, and reactive to light. No scleral icterus.  NECK: Normal range of motion, supple, no masses SKIN: Skin is warm and dry. No rash noted. Not diaphoretic. No erythema. No pallor. NEUROLOGIC: Alert and oriented to person, place, and time. Normal reflexes, muscle tone coordination. No cranial nerve deficit noted. PSYCHIATRIC: Normal mood and affect. Normal behavior. Normal judgment and thought content. CARDIOVASCULAR: Normal heart rate noted, regular rhythm RESPIRATORY: Effort and breath sounds normal, no problems with respiration noted ABDOMEN: Soft, nontender, nondistended. PELVIC: Deferred MUSCULOSKELETAL: Normal range of motion. No edema and no tenderness. 2+ distal pulses.    Labs: Lab Results  Component Value Date   WBC 8.5 01/20/2023   HGB 14.0 01/20/2023   HCT 42.8 01/20/2023   MCV 92.0 01/20/2023   PLT 287 01/20/2023      Imaging Studies: DG Abd 2 Views  Result Date: 01/05/2023 CLINICAL DATA:  IUD placement. EXAM: ABDOMEN - 2 VIEW COMPARISON:  Pelvic ultrasound 08/24/2023 FINDINGS: Lung bases are clear. Nonobstructive bowel gas pattern. IUD is situated along the left side of the pelvis. The IUD is rotated so that the stem is pointing towards the left and the arms are oriented superiorly and inferiorly. IMPRESSION: IUD is situated along the left side of the pelvis. Unusual orientation of the IUD and cannot exclude a malpositioned device. Electronically Signed   By: Markus Daft M.D.   On: 01/05/2023 11:57     CLINICAL DATA:  Intrauterine device string not visualized.   EXAM: TRANSABDOMINAL AND TRANSVAGINAL ULTRASOUND OF PELVIS   TECHNIQUE: Both transabdominal and transvaginal ultrasound examinations of the pelvis were performed. Transabdominal technique was performed for global imaging of the pelvis including uterus, ovaries, adnexal regions, and  pelvic cul-de-sac. It was necessary to proceed with endovaginal exam following the transabdominal exam to visualize the endometrium.   COMPARISON:  None Available.   FINDINGS: Uterus   Measurements: 7.0 x 3.7 x 5.4 cm = volume: 73.6 mL. No fibroids or other mass visualized.   Endometrium   Thickness: 4 mm.  Intrauterine device not definitively visualized.   Right ovary   Measurements: 2.4 x 2.1 x 1.8 cm = volume: 4.7 mL. Normal appearance/no adnexal mass.   Left ovary   Measurements: 3.0 x 1.7 x 1.8 cm = volume: 4.7 mL. Normal appearance/no adnexal mass.   Other findings   No abnormal free fluid.   IMPRESSION: Intrauterine device is not definitively visualized within the endometrial cavity.   Consider further evaluation with abdominal radiograph to assess for intrauterine device.     Electronically Signed   By: Lovey Newcomer M.D.   On: 12/04/2022 12:22   Assessment:    Lost IUD threads (IUD migration) Depression and anxiety  Contraception management.    Plan:   -  Counseling: Procedure, risks, reasons, benefits and complications (including injury to bowel, bladder, major blood vessel, ureter, bleeding, possibility of transfusion, infection, or fistula formation) reviewed in detail. Discussed likely possibility of IUD in abdominal cavity, however will attempt to visualize uterine cavity prior to proceeding with laparoscopy. Patient notes understanding. Routine postoperative instructions will be reviewed with the patient and her family in detail after surgery.  The patient concurred with the proposed plan, giving informed written consent for the surgery.   - Preop testing reviewed. - Patient has been NPO since midnight. - Has been taking OCPs (Slynd) since discovery of lost IUD.  Patient now inquires into BTL since she is undergoing surgery.  Has Medicaid, discussed need for sterilization form that requires 30 days of compliance prior to procedure. Discussed other  options for contraception, now ok with Nexplanon. Can place in OR.  - Notes that she ran out of her Lexapro last week, was unable to get refill from pharmacy although notes that she had 5 refills left.  States she does not have any symptoms at this time. Advised that if symptoms resume, can send new prescription to pharmacy.    Rubie Maid, MD [facility]

## 2023-01-26 NOTE — OR Nursing (Signed)
In and out cath performed by Dr. Marcelline Mates. 10 mL output.

## 2023-01-26 NOTE — Anesthesia Procedure Notes (Signed)
Procedure Name: Intubation Date/Time: 01/26/2023 9:24 AM  Performed by: Doreen Salvage, CRNAPre-anesthesia Checklist: Patient identified, Patient being monitored, Timeout performed, Emergency Drugs available and Suction available Patient Re-evaluated:Patient Re-evaluated prior to induction Oxygen Delivery Method: Circle system utilized Preoxygenation: Pre-oxygenation with 100% oxygen Induction Type: IV induction Ventilation: Mask ventilation without difficulty Laryngoscope Size: Mac and 3 Grade View: Grade I Tube type: Oral Tube size: 7.0 mm Number of attempts: 1 Airway Equipment and Method: Stylet Placement Confirmation: ETT inserted through vocal cords under direct vision, positive ETCO2 and breath sounds checked- equal and bilateral Secured at: 21 cm Tube secured with: Tape Dental Injury: Teeth and Oropharynx as per pre-operative assessment

## 2023-01-26 NOTE — Discharge Instructions (Signed)

## 2023-01-27 ENCOUNTER — Encounter: Payer: Self-pay | Admitting: Obstetrics and Gynecology

## 2023-01-29 NOTE — Progress Notes (Signed)
    OBSTETRICS/GYNECOLOGY POST-OPERATIVE CLINIC VISIT  Subjective:     Alexandra Olson is a 32 y.o. female who presents to the clinic 1 week status post DIAGNOSTIC HYSTEROSCOPY, LAPAROSCOPY OPERATIVE REMOVAL OF IUD, NEXPLANON INSERTION for  lost IUD . Eating a regular diet without difficulty. Bowel movements are normal. Pain is controlled with current analgesics. Medications being used: ibuprofen (OTC).  The following portions of the patient's history were reviewed and updated as appropriate: allergies, current medications, past family history, past medical history, past social history, past surgical history, and problem list.  Review of Systems Pertinent items are noted in HPI.   Objective:   BP 109/80   Pulse 89   Resp 16   Ht 5' 7.5" (1.715 m)   Wt 217 lb 11.2 oz (98.7 kg)   LMP 01/25/2023 Comment: UPT neg DOS  BMI 33.59 kg/m  Body mass index is 33.59 kg/m.  General:  alert and no distress  Abdomen: soft, bowel sounds active, non-tender  Incision:   healing well, no drainage, no erythema, no hernia, no seroma, no swelling, no dehiscence, incision well approximated.   Extremities:  Bruising around Nexplanon insertion site in left arm.     Pathology:  None  Assessment:   Patient s/p HYSTEROSCOPY INTRAUTERINE DEVICE (IUD) REMOVAL LAPAROSCOPY OPERATIVE REMOVAL OF IUD, NEXPLANON INSERTION.  Doing well postoperatively.   Plan:   1. Continue any current medications as instructed by provider. 2. Wound care discussed. 3. Operative findings again reviewed.  4. Activity restrictions: none 5. Anticipated return to work: not applicable. 6. Follow up:  For annual exam.  Patient desires to discuss long-term management with BTL and or hysterectomy.   Rubie Maid, MD. [place]

## 2023-01-29 NOTE — Patient Instructions (Signed)
Diagnostic Laparoscopy, Care After The following information offers guidance on how to care for yourself after your procedure. Your health care provider may also give you more specific instructions. If you have problems or questions, contact your health care provider. What can I expect after the procedure? After the procedure, it is common to have: Mild discomfort in the abdomen. Sore throat. Women who have laparoscopy with a pelvic examination may have mild cramping and fluid coming from the vagina for a few days after the procedure. Follow these instructions at home: Medicines Take over-the-counter and prescription medicines only as told by your health care provider. If you were prescribed an antibiotic medicine, take it as told by your health care provider. Do not stop taking the antibiotic even if you start to feel better. Ask your health care provider if the medicine prescribed to you: Requires you to avoid driving or using machinery. Can cause constipation. You may need to take these actions to prevent or treat constipation: Drink enough fluid to keep your urine pale yellow. Take over-the-counter or prescription medicines. Eat foods that are high in fiber, such as beans, whole grains, and fresh fruits and vegetables. Limit foods that are high in fat and processed sugars, such as fried or sweet foods. Incision care  Follow instructions from your health care provider about how to take care of your incisions. Make sure you: Wash your hands with soap and water for at least 20 seconds before and after you change your bandage (dressing). If soap and water are not available, use hand sanitizer. Change your dressing as told by your health care provider. Leave stitches (sutures), skin glue, or surgical tape in place. These skin closures may need to stay in place for 2 weeks or longer. If surgical tape edges start to loosen and curl up, you may trim the loose edges. Do not remove the surgical tape  completely unless your health care provider tells you to do that. Check your incision areas every day for signs of infection. Check for: Redness, swelling, or pain. Fluid or blood. Warmth. Pus or a bad smell. Activity Return to your normal activities as told by your health care provider. Ask your health care provider what activities are safe for you. Do not lift anything that is heavier than 10 lb (4.5 kg), or the limit that you are told, until your health care provider says that it is safe. Avoid sitting for a long time without moving. Get up to take short walks every 1-2 hours. This is important to improve blood flow and breathing. Ask for help if you feel weak or unsteady. General instructions Do not use any products that contain nicotine or tobacco. These products include cigarettes, chewing tobacco, and vaping devices, such as e-cigarettes. If you need help quitting, ask your health care provider. If you were given a sedative during the procedure, it can affect you for several hours. Do not drive or operate machinery until your health care provider says that it is safe. Do not take baths, swim, or use a hot tub until your health care provider approves. Ask your health care provider if you may take showers. You may only be allowed to take sponge baths. Keep all follow-up visits. This is important. Contact a health care provider if: You develop shoulder pain. You feel light-headed or faint. You are unable to pass gas or have a bowel movement. You feel nauseous or you vomit. You develop a rash. You have any of these signs of infection:  Redness, swelling, or pain around an incision. Fluid or blood coming from an incision. Warmth coming from an incision. Pus or a bad smell coming from an incision. A fever or chills. Get help right away if: You have severe pain. You have vomiting that does not go away. You have heavy bleeding from the vagina. Any incision opens up. You have trouble  breathing. You have chest pain. These symptoms may represent a serious problem that is an emergency. Do not wait to see if the symptoms will go away. Get medical help right away. Call your local emergency services (911 in the [place] Do not drive yourself to the hospital. Summary After the procedure, it is common to have mild discomfort in the abdomen and a sore throat. Check your incision areas every day for signs of infection. Return to your normal activities as told by your health care provider. Ask your health care provider what activities are safe for you. This information is not intended to replace advice given to you by your health care provider. Make sure you discuss any questions you have with your health care provider. Document Revised: 06/15/2020 Document Reviewed: 06/15/2020 Elsevier Patient Education  [place].

## 2023-02-03 ENCOUNTER — Ambulatory Visit (INDEPENDENT_AMBULATORY_CARE_PROVIDER_SITE_OTHER): Payer: Medicaid Other | Admitting: Obstetrics and Gynecology

## 2023-02-03 ENCOUNTER — Encounter: Payer: Self-pay | Admitting: Obstetrics and Gynecology

## 2023-02-03 VITALS — BP 109/80 | HR 89 | Resp 16 | Ht 67.5 in | Wt 217.7 lb

## 2023-02-03 DIAGNOSIS — Z09 Encounter for follow-up examination after completed treatment for conditions other than malignant neoplasm: Secondary | ICD-10-CM

## 2023-02-03 DIAGNOSIS — Z4889 Encounter for other specified surgical aftercare: Secondary | ICD-10-CM

## 2023-02-09 ENCOUNTER — Ambulatory Visit (INDEPENDENT_AMBULATORY_CARE_PROVIDER_SITE_OTHER): Payer: Medicaid Other | Admitting: Mental Health

## 2023-02-09 DIAGNOSIS — F53 Postpartum depression: Secondary | ICD-10-CM
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
## 2023-03-10 ENCOUNTER — Encounter: Payer: Self-pay | Admitting: Obstetrics and Gynecology

## 2023-03-25 NOTE — Progress Notes (Unsigned)
    GYNECOLOGY PROGRESS NOTE  Subjective:    Patient ID: Laurian Brim, female    DOB: 08/06/1991, 32 y.o.   MRN: [id number]  HPI  Patient is a 32 y.o. G69P2002 female who presents for televisit for mood check.   {Common ambulatory SmartLinks:19316}  Review of Systems {ros; complete:30496}   Objective:   currently breastfeeding. There is no height or weight on file to calculate BMI. General appearance: {general exam:16600} Abdomen: {abdominal exam:16834} Pelvic: {pelvic exam:16852::"cervix normal in appearance","external genitalia normal","no adnexal masses or tenderness","no cervical motion tenderness","rectovaginal septum normal","uterus normal size, shape, and consistency","vagina normal without discharge"} Extremities: {extremity exam:5109} Neurologic: {neuro exam:17854}   Assessment:   No diagnosis found.   Plan:   There are no diagnoses linked to this encounter.     I connected with  [place] on 03/25/23 by a video enabled telemedicine application and verified that I am speaking with the correct person using two identifiers.   I discussed the limitations of evaluation and management by telemedicine. The patient expressed understanding and agreed to proceed.    Hildred Laser, MD [facility]

## 2023-03-26 ENCOUNTER — Encounter: Payer: Self-pay | Admitting: Obstetrics and Gynecology

## 2023-03-26 ENCOUNTER — Telehealth (INDEPENDENT_AMBULATORY_CARE_PROVIDER_SITE_OTHER): Payer: Medicaid Other | Admitting: Obstetrics and Gynecology

## 2023-03-26 DIAGNOSIS — F419 Anxiety disorder, unspecified: Secondary | ICD-10-CM

## 2023-03-26 DIAGNOSIS — R454 Irritability and anger: Secondary | ICD-10-CM | POA: Diagnosis not present

## 2023-03-26 DIAGNOSIS — N921 Excessive and frequent menstruation with irregular cycle: Secondary | ICD-10-CM | POA: Diagnosis not present

## 2023-03-26 DIAGNOSIS — F32A Depression, unspecified: Secondary | ICD-10-CM

## 2023-03-26 DIAGNOSIS — Z975 Presence of (intrauterine) contraceptive device: Secondary | ICD-10-CM

## 2023-03-26 MED ORDER — NORETHIN ACE-ETH ESTRAD-FE 1-20 MG-MCG PO TABS: 1.0000 | ORAL_TABLET | Freq: Every day | ORAL | 0 refills | Status: DC

## 2023-03-26 MED ORDER — NORETHIN ACE-ETH ESTRAD-FE 1-20 MG-MCG PO TABS: 1.0000 | ORAL_TABLET | Freq: Every day | ORAL | 11 refills | Status: DC

## 2023-03-26 MED ORDER — ESCITALOPRAM OXALATE 10 MG PO TABS: 10.0000 mg | ORAL_TABLET | Freq: Every day | ORAL | 3 refills | Status: AC

## 2023-05-14 ENCOUNTER — Encounter: Payer: Self-pay | Admitting: Obstetrics and Gynecology

## 2023-05-14 ENCOUNTER — Ambulatory Visit (INDEPENDENT_AMBULATORY_CARE_PROVIDER_SITE_OTHER): Payer: Medicaid Other | Admitting: Obstetrics and Gynecology

## 2023-05-14 VITALS — BP 130/92 | HR 88 | Resp 16 | Ht 68.0 in | Wt 210.8 lb

## 2023-05-14 DIAGNOSIS — N921 Excessive and frequent menstruation with irregular cycle: Secondary | ICD-10-CM | POA: Diagnosis not present

## 2023-05-14 NOTE — Patient Instructions (Signed)
Hysterectomy Information  A hysterectomy is a surgery to take out the womb (uterus) or the womb and the lowest part of the womb (cervix), which opens into the vagina. The ovaries, the fallopian tubes, or both may also be taken out. After the procedure, a woman will no longer have menstrual periods and will not be able to get pregnant. What are the benefits? This surgery may improve your quality of life by relieving pain and heavy vaginal bleeding. This surgery may also: Treat long-term (chronic) infection in the area between your hip bones (pelvis). Treat conditions that affect the womb. Treat cancer of the womb or cervix. Lower the risk of cancer. It may also be done for transgender men to match their gender identity. What are the risks? Generally, this surgery is safe. But problems can happen, including: Bleeding. Needing donated blood (transfusion). Blood clots. Infection. Damage to nearby parts or organs. Allergic reactions to medicines. Needing to switch from a surgery that requires small cuts (incisions) to a surgery that requires a large cut. What are the different types? These are the three types: The top part of the womb is taken out, but not the cervix. The womb and cervix are taken out. The womb, the cervix, and the tissue that holds the womb in place are taken out. What happens during the procedure? This surgery may be done in one of these ways: A cut is made in the belly (abdomen). The womb is taken out through the opening. A cut is made in the vagina. The womb is taken out through the opening that is made. A device with a camera to help see is put through one of 3 or 4 cuts made in the belly. The womb is taken out through the vagina. A device with a camera to help see is put through one of 3 or 4 cuts made in the belly. The womb is cut into pieces and taken out through the openings or through the vagina. A computer helps control the surgical tools put into 3 or 4 cuts  made in the belly. The womb is cut into small pieces. The pieces are taken out through the openings or through the vagina. Talk with your doctor to see which is the right one for you. The procedures may vary among doctors and hospitals. What happens after the procedure? You will be given pain medicine. You may need to stay in the hospital for 1-2 days. You may need to have a responsible adult stay with you for a few days after you go home. If your ovaries were taken out, you may get hot flashes, have night sweats, and have trouble sleeping. Talk with your doctor about how often you need Pap tests. Keep all follow-up visits. Questions to ask your doctor Is this surgery needed? What other options do I have? What organs need to be taken out? How long will I need to stay in the hospital? How long will it take to get better at home? What symptoms can I expect after the procedure? Summary A hysterectomy is a surgery to take out your womb. This may be done to treat conditions. After this surgery, you will not have periods and you cannot get pregnant. There are different types of this surgery. Talk with your doctor about which is best for you. This surgery is safe, but there are some risks. This information is not intended to replace advice given to you by your health care provider. Make sure you discuss any questions   you have with your health care provider. Document Revised: 08/15/2020 Document Reviewed: 08/15/2020 Elsevier Patient Education  2024 Elsevier Inc.   

## 2023-05-14 NOTE — Progress Notes (Signed)
    GYNECOLOGY PROGRESS NOTE  Subjective:    Patient ID: Alexandra Olson, female    DOB: 08/04/1991, 32 y.o.   MRN: [id number]  HPI  Patient is a 32 y.o. G1P2002 female who presents for consultation for hysterectomy. She has a history of dysfunctional uterine bleeding.   She had an IUD that was placed 10/2022, however had this removed in April of this year via laparoscopy due to loss of IUD threads, with extrauterine migration into the abdominal cavity.  A Nexplanon was inserted at that time for contraception management and bleeding management, however patient continued to note bleeding despite its use.  Most recently has tried to suppress bleeding with a round of oral contraceptive pills (l Loestrin), however bleeding continued for an additional month despite its use.  She reports today that she stopped taking the Loestrin 5 days ago and her bleeding stopped 4 days ago. She does not desire future fertility so she has decided that she would like to have a hysterectomy to manage her bleeding.  The following portions of the patient's history were reviewed and updated as appropriate: allergies, current medications, past family history, past medical history, past social history, past surgical history, and problem list.  Review of Systems Pertinent items noted in HPI and remainder of comprehensive ROS otherwise negative.   Objective:   Blood pressure (!) 130/92, pulse 88, resp. rate 16, height 5\' 8"  (1.727 m), weight 210 lb 12.8 oz (95.6 kg), currently breastfeeding.  Body mass index is 32.05 kg/m. General appearance: alert and no distress Remainder of exam deferred   Assessment:   1. Breakthrough bleeding on Nexplanon      Plan:   - Discussed all other management options for abnormal uterine bleeding including NSAIDs (Naproxen), tranexamic acid (Lysteda),  Depo Provera, endometrial ablation or hysterectomy as definitive surgical management.  Also discussed the option of waiting for an  additional 1-2 cycles now that her bleeding has finally stopped to see if any further management is truly needed.  Discussed risks and benefits of each method.   Also discussed risk of regret based on patient's age if definitive management was undertaken.  Patient okay to wait until next cycle to see if it has returned to normal or if it remains of dysfunctional.  If it returns to normal, is okay to continue to manage with the Nexplanon.  Otherwise notes that she would desire to proceed with definitive management with hysterectomy.  RTC in 1 month follow-up symptoms.   A total of 15 minutes were spent face-to-face with the patient during this encounter and over half of that time dealt with counseling and coordination of care.  Hildred Laser, MD [facility]

## 2023-08-03 ENCOUNTER — Emergency Department
Admission: EM | Admit: 2023-08-03 | Discharge: 2023-08-03 | Disposition: A | Discharge status: Home / Self Care | Payer: Medicaid Other | Attending: Emergency Medicine | Admitting: Emergency Medicine

## 2023-08-03 ENCOUNTER — Other Ambulatory Visit: Payer: Self-pay

## 2023-08-03 ENCOUNTER — Emergency Department: Payer: Medicaid Other

## 2023-08-03 DIAGNOSIS — K625 Hemorrhage of anus and rectum: Secondary | ICD-10-CM | POA: Diagnosis not present

## 2023-08-03 DIAGNOSIS — R1030 Lower abdominal pain, unspecified: Secondary | ICD-10-CM

## 2023-08-03 DIAGNOSIS — K529 Noninfective gastroenteritis and colitis, unspecified: Secondary | ICD-10-CM | POA: Insufficient documentation

## 2023-08-03 DIAGNOSIS — R109 Unspecified abdominal pain: Secondary | ICD-10-CM | POA: Diagnosis present

## 2023-08-03 LAB — CBC
HCT: 44.4 % (ref 36.0–46.0)
Hemoglobin: 14.4 g/dL (ref 12.0–15.0)
MCH: 30.1 pg (ref 26.0–34.0)
MCHC: 32.4 g/dL (ref 30.0–36.0)
MCV: 92.9 fL (ref 80.0–100.0)
Platelets: 320 10*3/uL (ref 150–400)
RBC: 4.78 MIL/uL (ref 3.87–5.11)
RDW: 12.7 % (ref 11.5–15.5)
WBC: 11.1 10*3/uL — ABNORMAL HIGH (ref 4.0–10.5)
nRBC: 0 % (ref 0.0–0.2)

## 2023-08-03 LAB — COMPREHENSIVE METABOLIC PANEL
ALT: 15 U/L (ref 0–44)
AST: 15 U/L (ref 15–41)
Albumin: 4 g/dL (ref 3.5–5.0)
Alkaline Phosphatase: 75 U/L (ref 38–126)
Anion gap: 9 (ref 5–15)
BUN: 12 mg/dL (ref 6–20)
CO2: 21 mmol/L — ABNORMAL LOW (ref 22–32)
Calcium: 8.8 mg/dL — ABNORMAL LOW (ref 8.9–10.3)
Chloride: 107 mmol/L (ref 98–111)
Creatinine, Ser: 0.75 mg/dL (ref 0.44–1.00)
GFR, Estimated: >60 mL/min (ref >60)
Glucose, Bld: 111 mg/dL — ABNORMAL HIGH (ref 70–99)
Potassium: 4 mmol/L (ref 3.5–5.1)
Sodium: 137 mmol/L (ref 135–145)
Total Bilirubin: 0.4 mg/dL (ref 0.3–1.2)
Total Protein: 7.5 g/dL (ref 6.5–8.1)

## 2023-08-03 LAB — TYPE AND SCREEN
ABO/RH(D): A POS
Antibody Screen: NEGATIVE

## 2023-08-03 LAB — POC URINE PREG, ED: Preg Test, Ur: NEGATIVE

## 2023-08-03 LAB — LIPASE, BLOOD: Lipase: 30 U/L (ref 11–51)

## 2023-08-03 MED ORDER — IOHEXOL 300 MG/ML  SOLN
100.0000 mL | Freq: Once | INTRAMUSCULAR | Status: AC | PRN
  Administered 2023-08-03: 100 mL via INTRAVENOUS

## 2023-08-03 MED ORDER — LACTATED RINGERS IV BOLUS
1000.0000 mL | Freq: Once | INTRAVENOUS | Status: AC
  Administered 2023-08-03: 1000 mL via INTRAVENOUS

## 2023-08-03 MED ORDER — AMOXICILLIN-POT CLAVULANATE 875-125 MG PO TABS: 1.0000 | ORAL_TABLET | Freq: Two times a day (BID) | ORAL | 0 refills | Status: AC

## 2023-08-03 MED ORDER — ONDANSETRON HCL 4 MG/2ML IJ SOLN
4.0000 mg | Freq: Once | INTRAMUSCULAR | Status: AC
  Administered 2023-08-03: 4 mg via INTRAVENOUS
  Filled 2023-08-03: qty 2

## 2023-08-03 MED ORDER — ONDANSETRON 4 MG PO TBDP: 4.0000 mg | ORAL_TABLET | Freq: Three times a day (TID) | ORAL | 0 refills | Status: DC | PRN

## 2023-08-03 NOTE — ED Triage Notes (Signed)
Pt to ED via POV from home. Pt reports bilateral lower abd pain since 3am. Pt reports had a BM this morning and had mucous and bright red blood. Pt denies blood thinners. Pt reports hx of ruptured ovarian cyst.

## 2023-08-03 NOTE — ED Provider Notes (Signed)
[facility] Provider Note    Event Date/Time   First MD Initiated Contact with Patient 08/03/23 [phone]     (approximate)   History   Chief Complaint Abdominal Cramping and Rectal Bleeding   HPI  Alexandra Olson is a 32 y.o. female with past medical history of GERD who presents to the ED complaining of abdominal pain.  Patient reports that around 3:00 this morning she developed crampy pain in both sides of her lower abdomen associated with some diarrhea.  The second time she went to have a bowel movement, she began passing drops of bright red blood along with mucus.  She has had multiple bowel movements of mucus mixed with streaks of blood since then, reports passing a small amount of nonbloody stool as well.  She has not had any nausea and denies any vomiting, denies recent fevers or travel outside of the country.  She denies any history of similar symptoms, does not take any blood thinners.     Physical Exam   Triage Vital Signs: ED Triage Vitals  Encounter Vitals Group     BP 08/03/23 0733 (!) 138/98     Systolic BP Percentile --      Diastolic BP Percentile --      Pulse Rate 08/03/23 0733 68     Resp 08/03/23 0733 20     Temp 08/03/23 0733 98.7 F (37.1 C)     Temp Source 08/03/23 0733 Oral     SpO2 08/03/23 0733 98 %     Weight 08/03/23 0731 210 lb (95.3 kg)     Height 08/03/23 0731 5\' 8"  (1.727 m)     Head Circumference --      Peak Flow --      Pain Score 08/03/23 0731 6     Pain Loc --      Pain Education --      Exclude from Growth Chart --     Most recent vital signs: Vitals:   08/03/23 0733 08/03/23 0815  BP: (!) 138/98 (!) 142/97  Pulse: 68 (!) 57  Resp: 20   Temp: 98.7 F (37.1 C)   SpO2: 98% 99%    Constitutional: Alert and oriented. Eyes: Conjunctivae are normal. Head: Atraumatic. Nose: No congestion/rhinnorhea. Mouth/Throat: Mucous membranes are moist.  Cardiovascular: Normal rate, regular rhythm. Grossly normal  heart sounds.  2+ radial pulses bilaterally. Respiratory: Normal respiratory effort.  No retractions. Lungs CTAB. Gastrointestinal: Soft and tender to palpation in the bilateral lower quadrants, left greater than right.  No rebound or guarding noted. No distention. Musculoskeletal: No lower extremity tenderness nor edema.  Neurologic:  Normal speech and language. No gross focal neurologic deficits are appreciated.    ED Results / Procedures / Treatments   Labs (all labs ordered are listed, but only abnormal results are displayed) Labs Reviewed  COMPREHENSIVE METABOLIC PANEL - Abnormal; Notable for the following components:      Result Value   CO2 21 (*)    Glucose, Bld 111 (*)    Calcium 8.8 (*)    All other components within normal limits  CBC - Abnormal; Notable for the following components:   WBC 11.1 (*)    All other components within normal limits  LIPASE, BLOOD  POC URINE PREG, ED  TYPE AND SCREEN   RADIOLOGY CT abdomen/pelvis reviewed and interpreted by me with no focal fluid collections or dilated bowel loops, inflammatory changes noted at the colon.  PROCEDURES:  Critical Care  performed: No  Procedures   MEDICATIONS ORDERED IN ED: Medications  lactated ringers bolus 1,000 mL (1,000 mLs Intravenous New Bag/Given 08/03/23 0816)  iohexol (OMNIPAQUE) 300 MG/ML solution 100 mL (100 mLs Intravenous Contrast Given 08/03/23 0850)  ondansetron (ZOFRAN) injection 4 mg (4 mg Intravenous Given 08/03/23 0948)     IMPRESSION / MDM / ASSESSMENT AND PLAN / ED COURSE  I reviewed the triage vital signs and the nursing notes.                              32 y.o. female with past medical history of GERD who presents to the ED complaining of crampy lower abdominal pain associated with bloody stool since early this morning.  Patient's presentation is most consistent with acute presentation with potential threat to life or bodily function.  Differential diagnosis includes, but is  not limited to, colitis, diverticulitis, inflammatory bowel disease, gastroenteritis, anemia, electrolyte abnormality, AKI.  Patient nontoxic-appearing and in no acute distress, vital signs are unremarkable.  Her abdomen is soft but she has tenderness in the bilateral lower quadrants, left greater than right.  She did provide a picture of her recent bowel movement, appears to be mucus mixed with small amount of bright red blood.  She is hemodynamically stable and I have low suspicion for significant GI bleed at this time.  Given her tenderness, we will further assess with CT imaging, lab results are pending at this time.  We will hydrate with IV fluids, patient declines pain or nausea medication.  Pregnancy testing is also pending.  Pregnancy testing is negative, labs reassuring with mild leukocytosis but no significant anemia, electrolyte abnormality, or AKI.  LFTs and lipase are also unremarkable.  CT imaging consistent with mild colitis, no other acute findings noted.  Patient with no grossly bloody bowel movements here in the ED and she is tolerating oral intake without difficulty.  She is appropriate for outpatient management and we will start on Augmentin, also provide with Zofran.  She was counseled to follow-up with her PCP and to return to the ED for new or worsening symptoms, patient agrees with plan.      FINAL CLINICAL IMPRESSION(S) / ED DIAGNOSES   Final diagnoses:  Lower abdominal pain  Rectal bleeding  Colitis     Rx / DC Orders   ED Discharge Orders          Ordered    amoxicillin-clavulanate (AUGMENTIN) 875-125 MG tablet  2 times daily        08/03/23 0953    ondansetron (ZOFRAN-ODT) 4 MG disintegrating tablet  Every 8 hours PRN        08/03/23 0953             Note:  This document was prepared using Dragon voice recognition software and may include unintentional dictation errors.   Chesley Noon, MD 08/03/23 [phone]

## 2023-08-03 NOTE — ED Notes (Signed)
Pt reports improvement of nausea

## 2023-08-03 NOTE — ED Notes (Signed)
See triage note, pt reports lower abd cramping started this am with bloody stool. +nausea. NAD noted

## 2023-08-03 NOTE — ED Notes (Signed)
Ambulatory to restroom

## 2023-09-10 NOTE — Patient Instructions (Signed)
Preventive Care 21-30 Years Old, Female Preventive care refers to lifestyle choices and visits with your health care provider that can promote health and wellness. Preventive care visits are also called wellness exams. What can I expect for my preventive care visit? Counseling During your preventive care visit, your health care provider may ask about your: Medical history, including: Past medical problems. Family medical history. Pregnancy history. Current health, including: Menstrual cycle. Method of birth control. Emotional well-being. Home life and relationship well-being. Sexual activity and sexual health. Lifestyle, including: Alcohol, nicotine or tobacco, and drug use. Access to firearms. Diet, exercise, and sleep habits. Work and work environment. Sunscreen use. Safety issues such as seatbelt and bike helmet use. Physical exam Your health care provider may check your: Height and weight. These may be used to calculate your BMI (body mass index). BMI is a measurement that tells if you are at a healthy weight. Waist circumference. This measures the distance around your waistline. This measurement also tells if you are at a healthy weight and may help predict your risk of certain diseases, such as type 2 diabetes and high blood pressure. Heart rate and blood pressure. Body temperature. Skin for abnormal spots. What immunizations do I need?  Vaccines are usually given at various ages, according to a schedule. Your health care provider will recommend vaccines for you based on your age, medical history, and lifestyle or other factors, such as travel or where you work. What tests do I need? Screening Your health care provider may recommend screening tests for certain conditions. This may include: Pelvic exam and Pap test. Lipid and cholesterol levels. Diabetes screening. This is done by checking your blood sugar (glucose) after you have not eaten for a while (fasting). Hepatitis  B test. Hepatitis C test. HIV (human immunodeficiency virus) test. STI (sexually transmitted infection) testing, if you are at risk. BRCA-related cancer screening. This may be done if you have a family history of breast, ovarian, tubal, or peritoneal cancers. Talk with your health care provider about your test results, treatment options, and if necessary, the need for more tests. Follow these instructions at home: Eating and drinking  Eat a healthy diet that includes fresh fruits and vegetables, whole grains, lean protein, and low-fat dairy products. Take vitamin and mineral supplements as recommended by your health care provider. Do not drink alcohol if: Your health care provider tells you not to drink. You are pregnant, may be pregnant, or are planning to become pregnant. If you drink alcohol: Limit how much you have to 0-1 drink a day. Know how much alcohol is in your drink. In the [place] one drink equals one 12 oz bottle of beer (355 mL), one 5 oz glass of wine (148 mL), or one 1 oz glass of hard liquor (44 mL). Lifestyle Brush your teeth every morning and night with fluoride toothpaste. Floss one time each day. Exercise for at least 30 minutes 5 or more days each week. Do not use any products that contain nicotine or tobacco. These products include cigarettes, chewing tobacco, and vaping devices, such as e-cigarettes. If you need help quitting, ask your health care provider. Do not use drugs. If you are sexually active, practice safe sex. Use a condom or other form of protection to prevent STIs. If you do not wish to become pregnant, use a form of birth control. If you plan to become pregnant, see your health care provider for a prepregnancy visit. Find healthy ways to manage stress, such as: Meditation,   yoga, or listening to music. Journaling. Talking to a trusted person. Spending time with friends and family. Minimize exposure to UV radiation to reduce your risk of skin  cancer. Safety Always wear your seat belt while driving or riding in a vehicle. Do not drive: If you have been drinking alcohol. Do not ride with someone who has been drinking. If you have been using any mind-altering substances or drugs. While texting. When you are tired or distracted. Wear a helmet and other protective equipment during sports activities. If you have firearms in your house, make sure you follow all gun safety procedures. Seek help if you have been physically or sexually abused. What's next? Go to your health care provider once a year for an annual wellness visit. Ask your health care provider how often you should have your eyes and teeth checked. Stay up to date on all vaccines. This information is not intended to replace advice given to you by your health care provider. Make sure you discuss any questions you have with your health care provider. Document Revised: 04/17/2021 Document Reviewed: 04/17/2021 Elsevier Patient Education  2024 [employer] Breast Self-Awareness Breast self-awareness is knowing how your breasts look and feel. You need to: Check your breasts on a regular basis. Tell your doctor about any changes. Become familiar with the look and feel of your breasts. This can help you catch a breast problem while it is still small and can be treated. You should do breast self-exams even if you have breast implants. What you need: A mirror. A well-lit room. A pillow or other soft object. How to do a breast self-exam Follow these steps to do a breast self-exam: Look for changes  Take off all the clothes above your waist. Stand in front of a mirror in a room with good lighting. Put your hands down at your sides. Compare your breasts in the mirror. Look for any difference between them, such as: A difference in shape. A difference in size. Wrinkles, dips, and bumps in one breast and not the other. Look at each breast for changes in the skin, such  as: Redness. Scaly areas. Skin that has gotten thicker. Dimpling. Open sores (ulcers). Look for changes in your nipples, such as: Fluid coming out of a nipple. Fluid around a nipple. Bleeding. Dimpling. Redness. A nipple that looks pushed in (retracted), or that has changed position. Feel for changes Lie on your back. Feel each breast. To do this: Pick a breast to feel. Place a pillow under the shoulder closest to that breast. Put the arm closest to that breast behind your head. Feel the nipple area of that breast using the hand of your other arm. Feel the area with the pads of your three middle fingers by making small circles with your fingers. Use light, medium, and firm pressure. Continue the overlapping circles, moving downward over the breast. Keep making circles with your fingers. Stop when you feel your ribs. Start making circles with your fingers again, this time going upward until you reach your collarbone. Then, make circles outward across your breast and into your armpit area. Squeeze your nipple. Check for discharge and lumps. Repeat these steps to check your other breast. Sit or stand in the tub or shower. With soapy water on your skin, feel each breast the same way you did when you were lying down. Write down what you find Writing down what you find can help you remember what to tell your doctor. Write down: What is   normal for each breast. Any changes you find in each breast. These include: The kind of changes you find. A tender or painful breast. Any lump you find. Write down its size and where it is. When you last had your monthly period (menstrual cycle). General tips If you are breastfeeding, the best time to check your breasts is after you feed your baby or after you use a breast pump. If you get monthly bleeding, the best time to check your breasts is 5-7 days after your monthly cycle ends. With time, you will become comfortable with the self-exam. You will  also start to know if there are changes in your breasts. Contact a doctor if: You see a change in the shape or size of your breasts or nipples. You see a change in the skin of your breast or nipples, such as red or scaly skin. You have fluid coming from your nipples that is not normal. You find a new lump or thick area. You have breast pain. You have any concerns about your breast health. Summary Breast self-awareness includes looking for changes in your breasts and feeling for changes within your breasts. You should do breast self-awareness in front of a mirror in a well-lit room. If you get monthly periods (menstrual cycles), the best time to check your breasts is 5-7 days after your period ends. Tell your doctor about any changes you see in your breasts. Changes include changes in size, changes on the skin, painful or tender breasts, or fluid from your nipples that is not normal. This information is not intended to replace advice given to you by your health care provider. Make sure you discuss any questions you have with your health care provider. Document Revised: 03/27/2022 Document Reviewed: 08/22/2021 Elsevier Patient Education  2024 [employer]  

## 2023-09-10 NOTE — Progress Notes (Signed)
GYNECOLOGY ANNUAL PHYSICAL EXAM PROGRESS NOTE  Subjective:    Alexandra Olson is a 32 y.o. G17P2002 female who presents for an annual exam.  The patient is sexually active. The patient participates in regular exercise: yes. Has the patient ever been transfused or tattooed?: yes. The patient reports that there is not domestic violence in her life.   The patient has the following complaints today: She continues to bleed monthly with the Nexplanon. Sometimes she bleeds for 2 months at a time.  Has tried utilization of supplemental OCPs in the past and notes that a few times it has worked but other times it has not.  Menstrual History: Menarche age: 48 Patient's last menstrual period was 09/10/2023. Period Duration (Days): 5-14 Period Pattern: (!) Irregular Menstrual Flow: Heavy Menstrual Control: Tampon Menstrual Control Change Freq (Hours): 4 Dysmenorrhea: (!) Moderate Dysmenorrhea Symptoms: Other (Comment) (Back pain)   Gynecologic History:  Contraception: Nexplanon History of STI's: Yes Last Pap: 01/13/2022. Results were: normal. Denies h/o abnormal pap smears. Last mammogram: Not age appropriate    Upstream - 09/11/23 0744       Contraception Wrap Up   End Method Hormonal Implant    Contraception Counseling Provided Yes    How was the end contraceptive method provided? N/A            The pregnancy intention screening data noted above was reviewed. Potential methods of contraception were discussed. The patient elected to proceed with Hormonal Implant.    OB History  Gravida Para Term Preterm AB Living  2 2 2  0 0 2  SAB IAB Ectopic Multiple Live Births  0 0 0 0 2    # Outcome Date GA Lbr Len/2nd Weight Sex Type Anes PTL Lv  2 Term 08/24/22 [redacted]w[redacted]d / 01:41 8 lb 1.8 oz (3.68 kg) F Vag-Spont EPI  LIV     Name: SHERILL, ARREOLA     Apgar1: 9  Apgar5: 9  1 Term 05/26/15 [redacted]w[redacted]d 17:22 / 00:56  M Vag-Spont EPI  LIV     Apgar1: 9  Apgar5: 9    Past Medical  History:  Diagnosis Date   Anxiety    GERD (gastroesophageal reflux disease)    Postpartum depression 01/01/2023    Past Surgical History:  Procedure Laterality Date   INSERTION OF NON VAGINAL CONTRACEPTIVE DEVICE Left 01/26/2023   Procedure: INSERTION OF NON VAGINAL CONTRACEPTIVE DEVICE;  Surgeon: Hildred Laser, MD;  Location: [facility];  Service: Gynecology;  Laterality: Left;   IUD REMOVAL N/A 01/26/2023   Procedure: HYSTEROSCOPY;  Surgeon: Hildred Laser, MD;  Location: [facility];  Service: Gynecology;  Laterality: N/A;   LAPAROSCOPY  01/26/2023   Procedure: LAPAROSCOPY - OPERATIVE REMOVAL OF IUD;  Surgeon: Hildred Laser, MD;  Location: [facility];  Service: Gynecology;;   WISDOM TOOTH EXTRACTION  12/2015    Family History  Problem Relation Age of Onset   Diabetes Maternal Grandfather    Diabetes Paternal Grandfather    Breast cancer Maternal Aunt     Social History   Socioeconomic History   Marital status: Married    Spouse name: Molli Hazard   Number of children: Not on file   Years of education: Not on file   Highest education level: Not on file  Occupational History   Not on file  Tobacco Use   Smoking status: Former    Current packs/day: 0.20    Types: Cigarettes   Smokeless tobacco: Never  Vaping Use   Vaping status:  Former  Substance and Sexual Activity   Alcohol use: Not Currently    Comment: every once in a while   Drug use: Not Currently    Frequency: 1.0 times per week    Types: Marijuana   Sexual activity: Yes    Birth control/protection: I.U.D.    Comment: Mirena inserted on 10/21/22  Other Topics Concern   Not on file  Social History Narrative   Lives at home with husband   Social Determinants of Health   Financial Resource Strain: Medium Risk (02/09/2023)   Overall Financial Resource Strain (CARDIA)    Difficulty of Paying Living Expenses: Somewhat hard  Food Insecurity: No Food Insecurity (08/23/2022)   Hunger Vital Sign    Worried About Running Out  of Food in the Last Year: Never true    Ran Out of Food in the Last Year: Never true  Transportation Needs: No Transportation Needs (08/23/2022)   PRAPARE - Administrator, Civil Service (Medical): No    Lack of Transportation (Non-Medical): No  Physical Activity: Insufficiently Active (02/09/2023)   Exercise Vital Sign    Days of Exercise per Week: 4 days    Minutes of Exercise per Session: 30 min  Stress: Stress Concern Present (02/09/2023)   [employer] of Occupational Health - Occupational Stress Questionnaire    Feeling of Stress : To some extent  Social Connections: Moderately Isolated (02/09/2023)   Social Connection and Isolation Panel [NHANES]    Frequency of Communication with Friends and Family: More than three times a week    Frequency of Social Gatherings with Friends and Family: Once a week    Attends Religious Services: Never    Database administrator or Organizations: No    Attends Banker Meetings: Never    Marital Status: Married  Catering manager Violence: Not At Risk (02/09/2023)   Humiliation, Afraid, Rape, and Kick questionnaire    Fear of Current or Ex-Partner: No    Emotionally Abused: No    Physically Abused: No    Sexually Abused: No    Current Outpatient Medications on File Prior to Visit  Medication Sig Dispense Refill   cetirizine (ZYRTEC) 10 MG tablet Take 10 mg by mouth daily.     escitalopram (LEXAPRO) 10 MG tablet Take 1 tablet (10 mg total) by mouth daily. Start with 1/2 tablet daily for 2 weeks, then increase to 1 tablet daily. 90 tablet 3   ibuprofen (ADVIL) 800 MG tablet Take 1 tablet (800 mg total) by mouth every 8 (eight) hours as needed for mild pain or moderate pain. 30 tablet 0   ondansetron (ZOFRAN-ODT) 4 MG disintegrating tablet Take 1 tablet (4 mg total) by mouth every 8 (eight) hours as needed for nausea or vomiting. 12 tablet 0   No current facility-administered medications on file prior to visit.    Allergies   Allergen Reactions   Zoloft [Sertraline Hcl] Other (See Comments)    Extreme migraines Extreme migraines     Review of Systems Constitutional: negative for chills, fatigue, fevers and sweats Eyes: negative for irritation, redness and visual disturbance Ears, nose, mouth, throat, and face: negative for hearing loss, nasal congestion, snoring and tinnitus Respiratory: negative for asthma, cough, sputum Cardiovascular: negative for chest pain, dyspnea, exertional chest pressure/discomfort, irregular heart beat, palpitations and syncope Gastrointestinal: negative for abdominal pain, change in bowel habits, nausea and vomiting Genitourinary: positive for abnormal menstrual periods (see HPI).  Negative for genital lesions, sexual problems and  vaginal discharge, dysuria and urinary incontinence Integument/breast: negative for breast lump, breast tenderness and nipple discharge Hematologic/lymphatic: negative for bleeding and easy bruising Musculoskeletal:negative for back pain and muscle weakness Neurological: negative for dizziness, headaches, vertigo and weakness Endocrine: negative for diabetic symptoms including polydipsia, polyuria and skin dryness Allergic/Immunologic: negative for hay fever and urticaria      Objective:  Blood pressure 123/83, pulse 83, resp. rate 16, height 5\' 8"  (1.727 m), weight 211 lb 11.2 oz (96 kg), last menstrual period 09/10/2023, not currently breastfeeding.  Body mass index is 32.19 kg/m.    General Appearance:    Alert, cooperative, no distress, appears stated age  Head:    Normocephalic, without obvious abnormality, atraumatic  Eyes:    PERRL, conjunctiva/corneas clear, EOM's intact, both eyes  Ears:    Normal external ear canals, both ears  Nose:   Nares normal, septum midline, mucosa normal, no drainage or sinus tenderness  Throat:   Lips, mucosa, and tongue normal; teeth and gums normal  Neck:   Supple, symmetrical, trachea midline, no adenopathy;  thyroid: no enlargement/tenderness/nodules; no carotid bruit or JVD  Back:     Symmetric, no curvature, ROM normal, no CVA tenderness  Lungs:     Clear to auscultation bilaterally, respirations unlabored  Chest Wall:    No tenderness or deformity   Heart:    Regular rate and rhythm, S1 and S2 normal, no murmur, rub or gallop  Breast Exam:    No tenderness, masses, or nipple abnormality  Abdomen:     Soft, non-tender, bowel sounds active all four quadrants, no masses, no organomegaly.    Genitalia:    Pelvic:external genitalia normal, vagina without lesions, discharge, or tenderness, rectovaginal septum  normal. Cervix normal in appearance, no cervical motion tenderness, no adnexal masses or tenderness.  Uterus normal size, shape, mobile, regular contours, nontender.  Rectal:    Normal external sphincter.  No hemorrhoids appreciated. Internal exam not done.   Extremities:   Extremities normal, atraumatic, no cyanosis or edema  Pulses:   2+ and symmetric all extremities  Skin:   Skin color, texture, turgor normal, no rashes or lesions  Lymph nodes:   Cervical, supraclavicular, and axillary nodes normal  Neurologic:   CNII-XII intact, normal strength, sensation and reflexes throughout   .  Labs:  Lab Results  Component Value Date   WBC 11.1 (H) 08/03/2023   HGB 14.4 08/03/2023   HCT 44.4 08/03/2023   MCV 92.9 08/03/2023   PLT 320 08/03/2023    Lab Results  Component Value Date   CREATININE 0.75 08/03/2023   BUN 12 08/03/2023   NA 137 08/03/2023   K 4.0 08/03/2023   CL 107 08/03/2023   CO2 21 (L) 08/03/2023    Lab Results  Component Value Date   ALT 15 08/03/2023   AST 15 08/03/2023   ALKPHOS 75 08/03/2023   BILITOT 0.4 08/03/2023    Lab Results  Component Value Date   TSH 2.230 03/11/2022     Assessment:   1. Encounter for well woman exam with routine gynecological exam   2. Breakthrough bleeding on Nexplanon      Plan:  - Blood tests: UTD. - Breast self exam  technique reviewed and patient encouraged to perform self-exam monthly. - Contraception: Nexplanon.  Discussed option of removing Nexplanon and replacing with different birth control, however patient declines.  Notes that she has been on several birth controls and typically will always have irregular bleeding.  I then  discussed option of supplementing with progesterone only OCP or utilizing NSAIDs scheduled for 7 days to see if bleeding decreases as well.  Patient notes she will try this first, if no improvement then we will try progesterone only pill.  Given 1 sample of Slynd.  Can notify provider if prescription works and would like to take regularly. - Discussed healthy lifestyle modifications. - Mammogram  : Not age appropriate - Pap smear  UTD . - Flu vaccine: Declined - Follow up in 1 year for annual exam   Hildred Laser, MD [facility]

## 2023-09-11 ENCOUNTER — Encounter: Payer: Self-pay | Admitting: Obstetrics and Gynecology

## 2023-09-11 ENCOUNTER — Ambulatory Visit (INDEPENDENT_AMBULATORY_CARE_PROVIDER_SITE_OTHER): Payer: Medicaid Other | Admitting: Obstetrics and Gynecology

## 2023-09-11 VITALS — BP 123/83 | HR 83 | Resp 16 | Ht 68.0 in | Wt 211.7 lb

## 2023-09-11 DIAGNOSIS — R638 Other symptoms and signs concerning food and fluid intake: Secondary | ICD-10-CM

## 2023-09-11 DIAGNOSIS — N921 Excessive and frequent menstruation with irregular cycle: Secondary | ICD-10-CM

## 2023-09-11 DIAGNOSIS — Z01419 Encounter for gynecological examination (general) (routine) without abnormal findings: Secondary | ICD-10-CM

## 2023-10-17 IMAGING — US US OB COMP LESS 14 WK
1 series · 14 of 28 positions shown · non-contrast
Comparison: None.

CLINICAL DATA: Pregnant patient.  Dating a fetus.

EXAM:
OBSTETRIC <14 WK ULTRASOUND
TECHNIQUE: Transabdominal ultrasound was performed for evaluation of the
gestation as well as the maternal uterus and adnexal regions.

[Series 1: us ob comp less 14 wks · 14 of 53 slices shown]
[im 2/53]
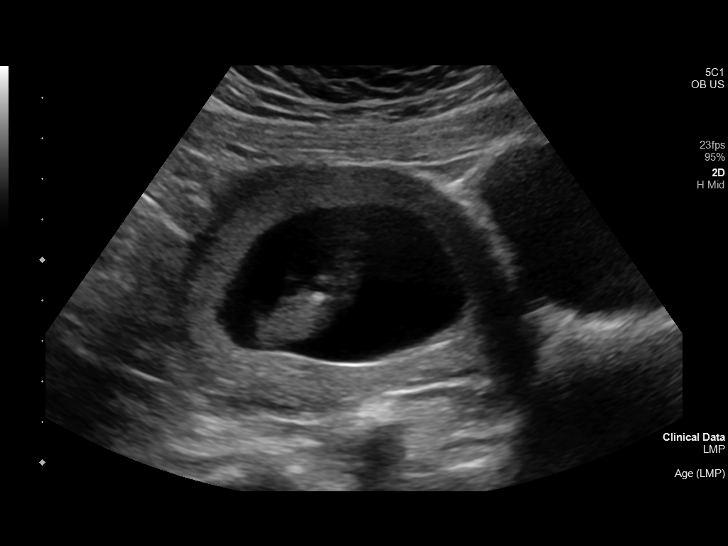
[im 6/53]
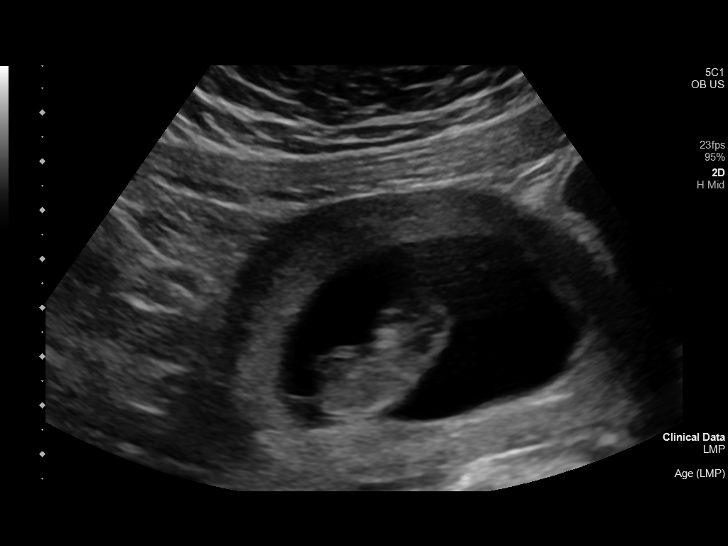
[im 10/53]
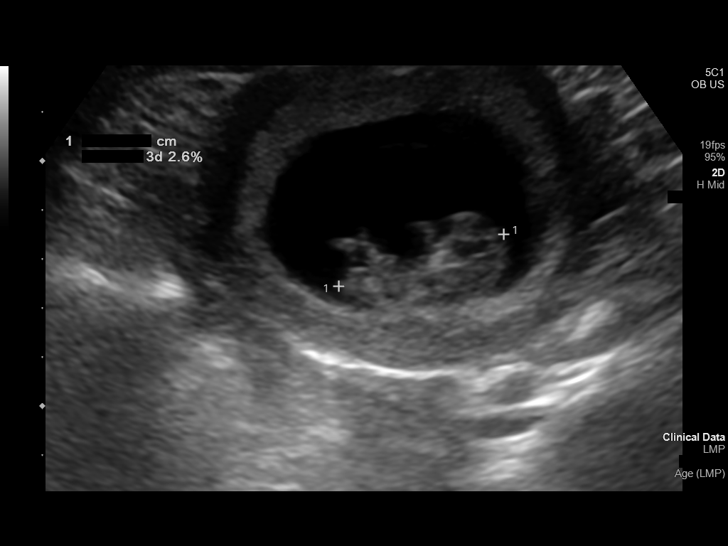
[im 14/53]
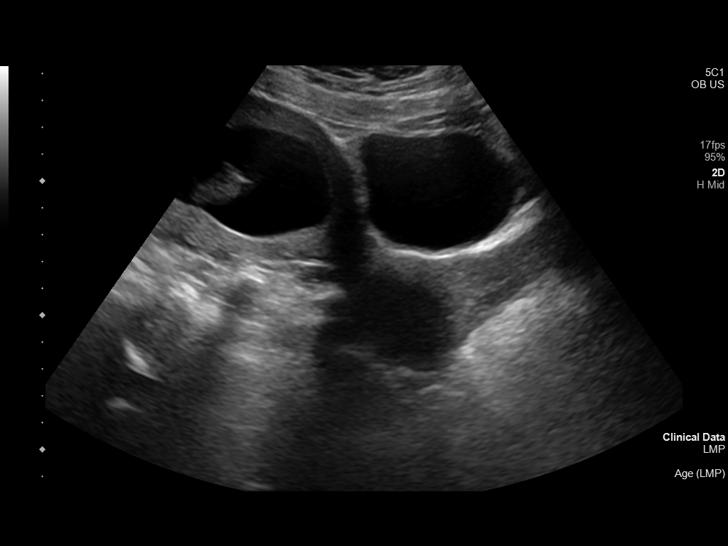
[im 18/53]
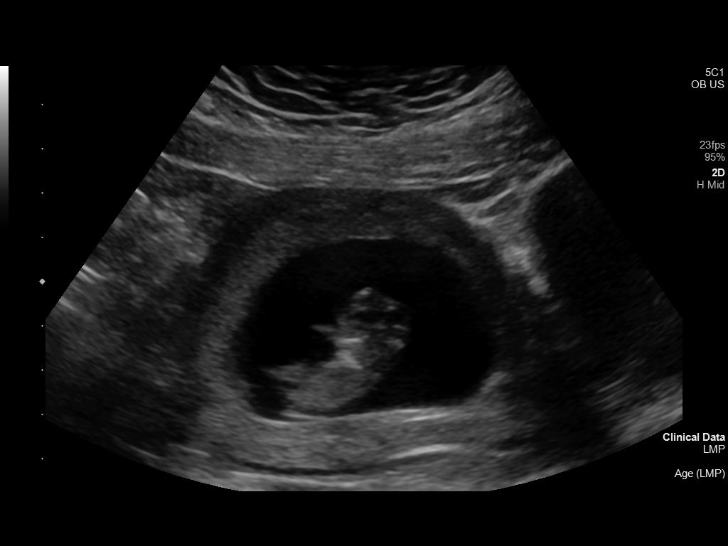
[im 22/53]
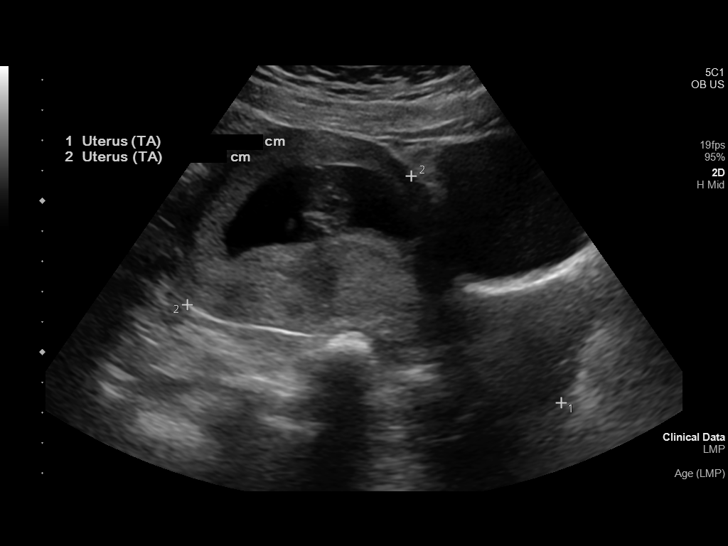
[im 26/53]
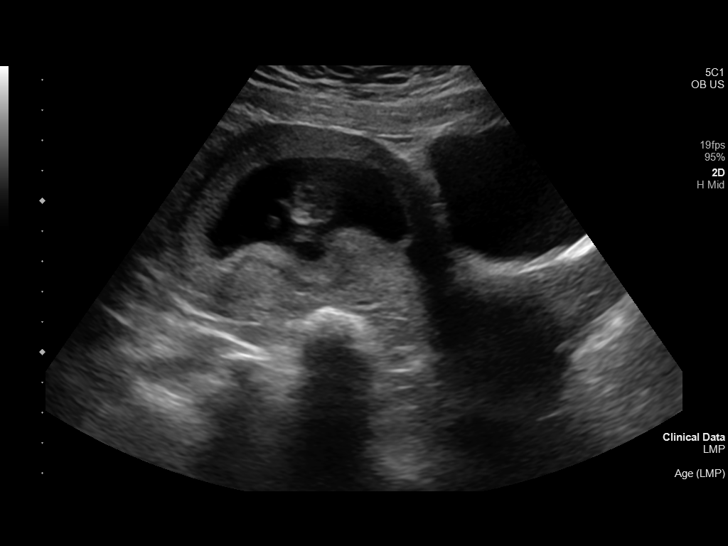
[im 29/53]
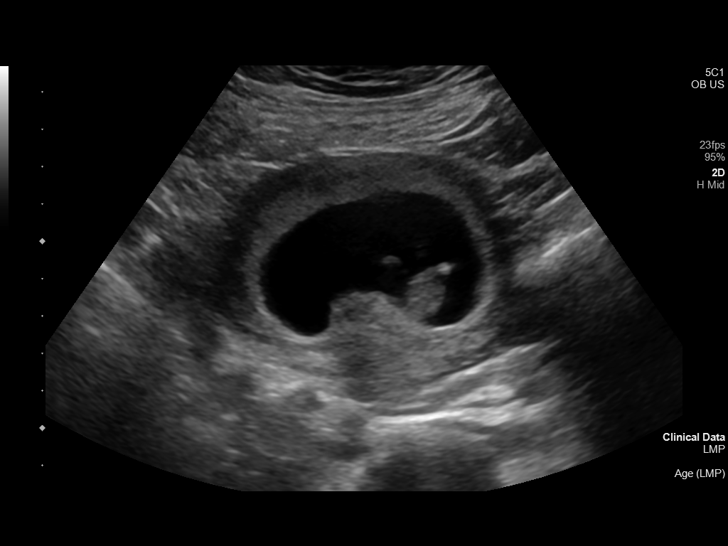
[im 33/53]
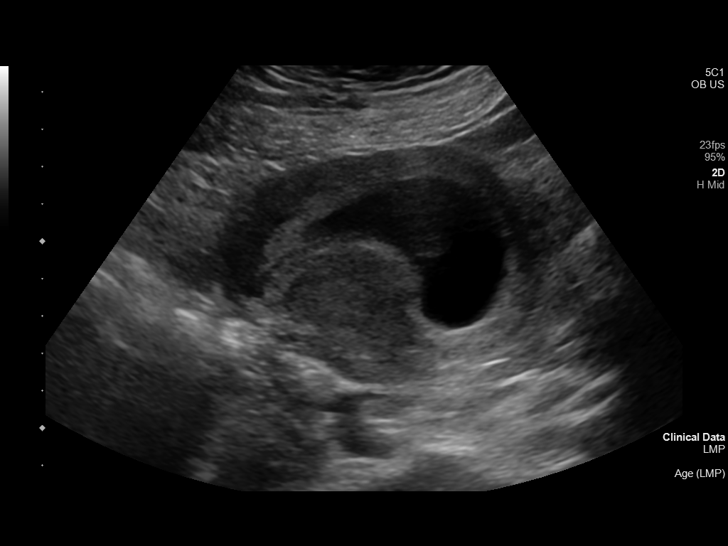
[im 37/53]
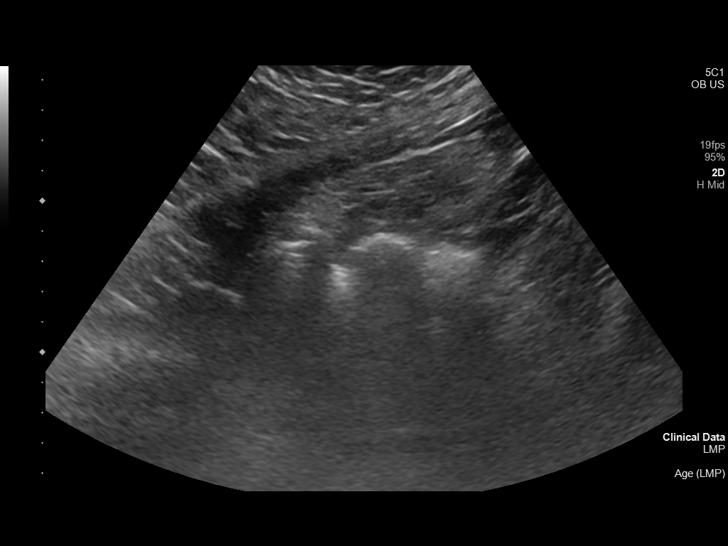
[im 41/53]
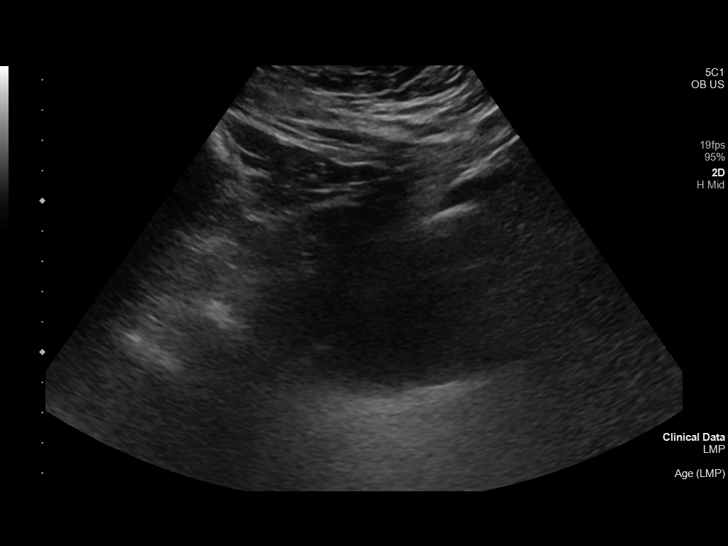
[im 45/53]
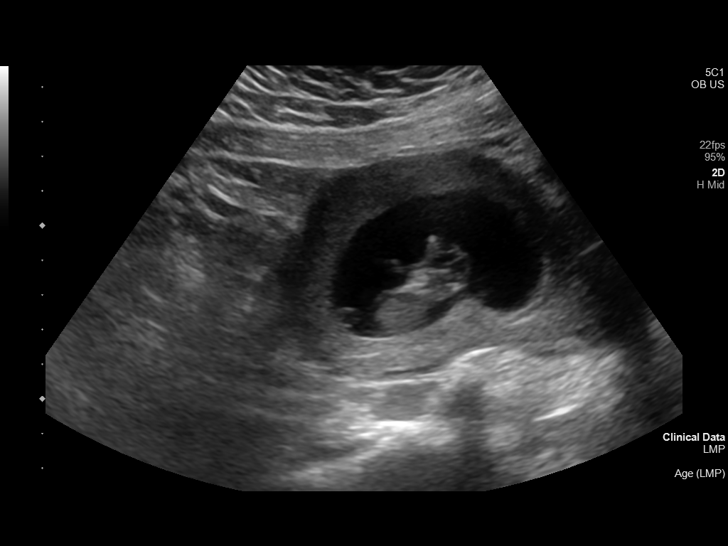
[im 49/53]
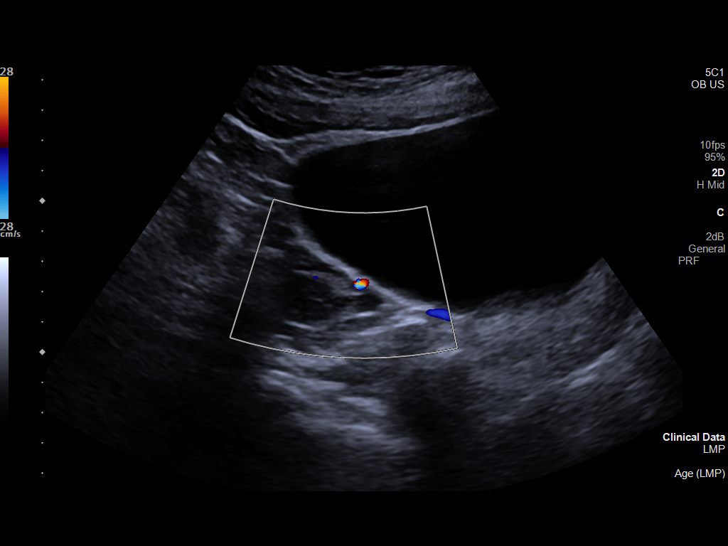
[im 53/53]
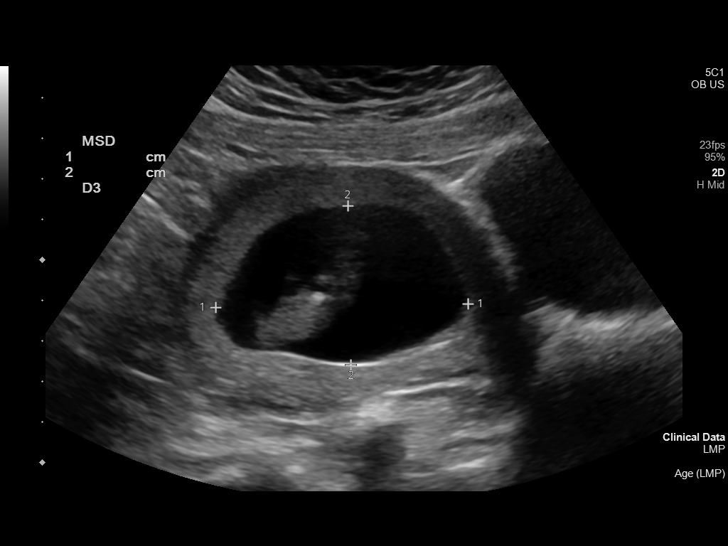

[14 of 28 positions shown; findings below may reference images not displayed]

FINDINGS: Intrauterine gestational sac: Single

Yolk sac:  Not Visualized.

Embryo:  Visualized.

Cardiac Activity: Visualized.

Heart Rate: 171 bpm

MSD:    mm    w     d

CRL:   34.3 mm   10 w 2 d                  US EDC: August 30, 2022

Subchorionic hemorrhage:  None visualized.

Maternal uterus/adnexae: The right ovary is not visualized. The left
ovary is unremarkable.
IMPRESSION: Single live IUP. By crown-rump length, the gestational age is 10
weeks and 2 days. No abnormalities identified.

The right ovary was not visualized.

## 2023-12-20 IMAGING — US US MFM OB DETAIL+14 WK
1 series · 13 of 28 positions shown · non-contrast
Comparison: none

[Series 1: us mfm ob detail+14 wk · 92 acquisitions, 13 frames shown]
[im 4/92]
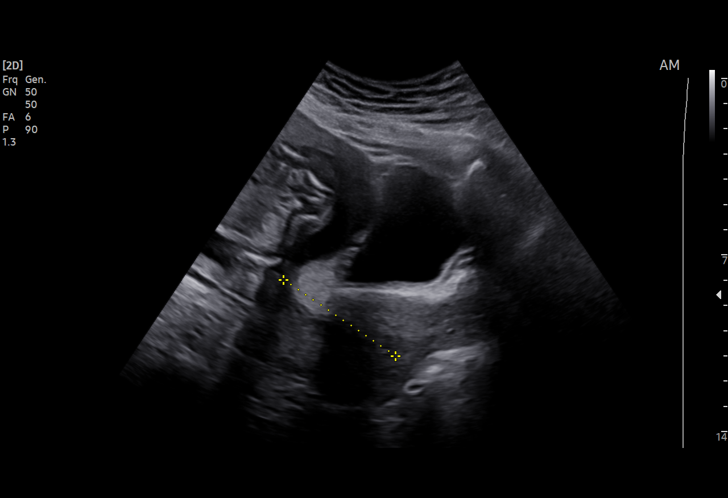
[im 11/92]
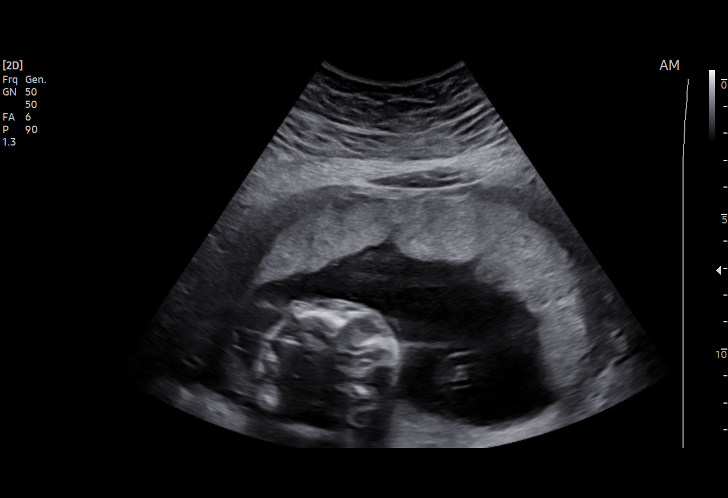
[im 17/92]
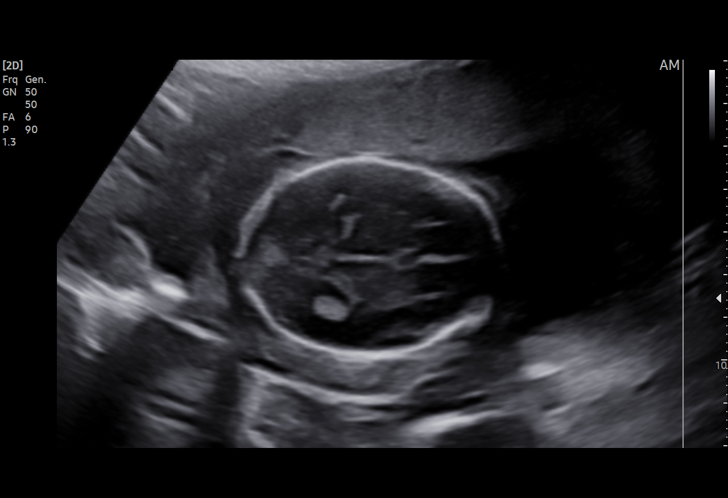
[im 24/92]
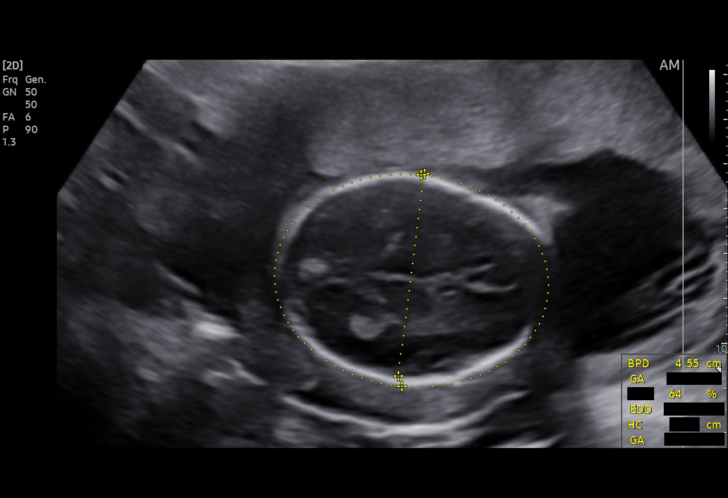
[im 31/92]
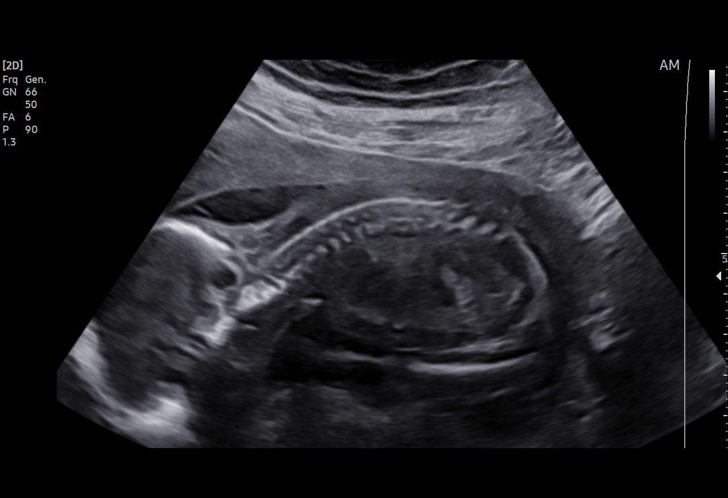
[im 38/92]
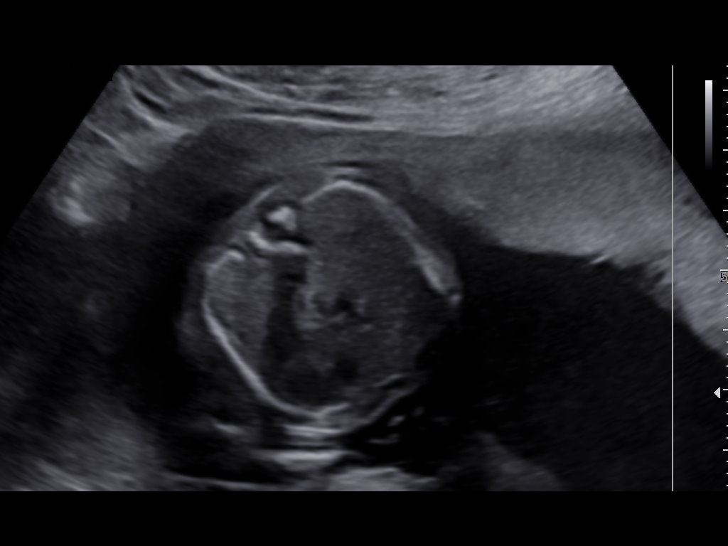
[im 48/92]
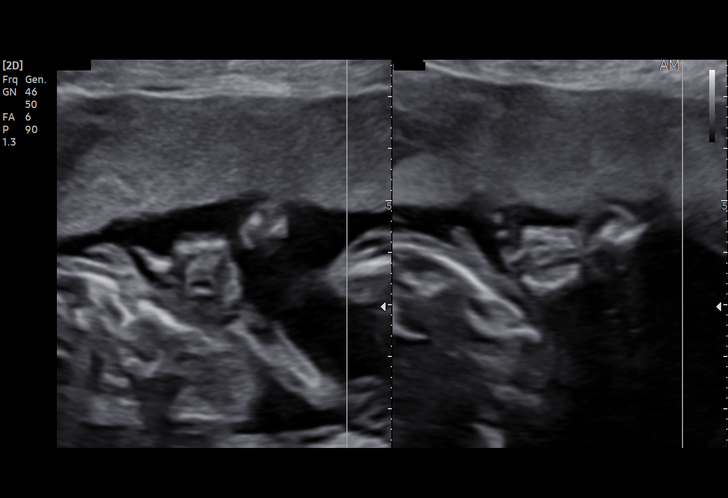
[im 54/92]
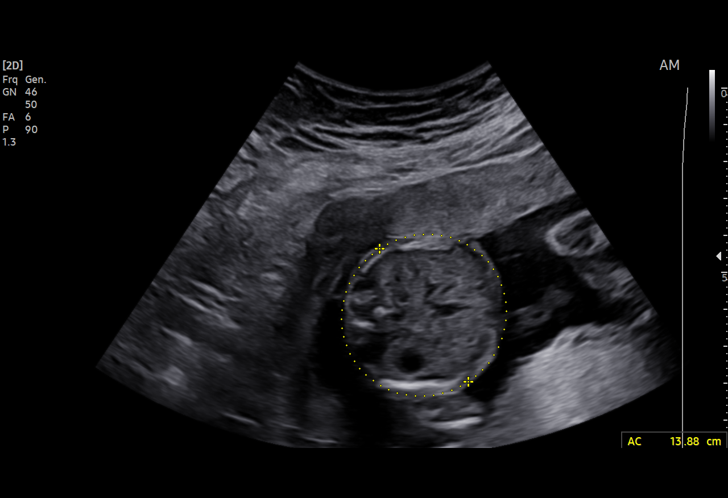
[im 61/92]
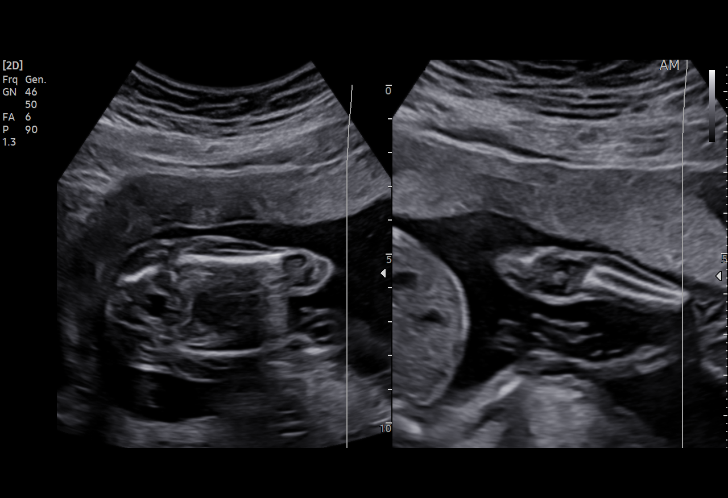
[im 68/92]
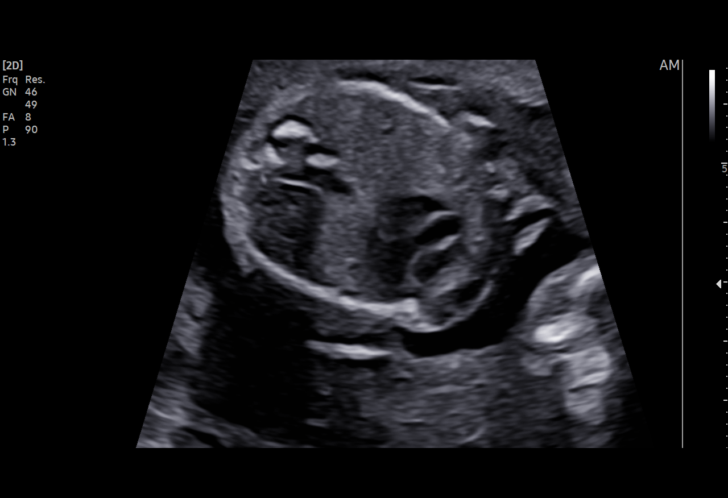
[im 75/92]
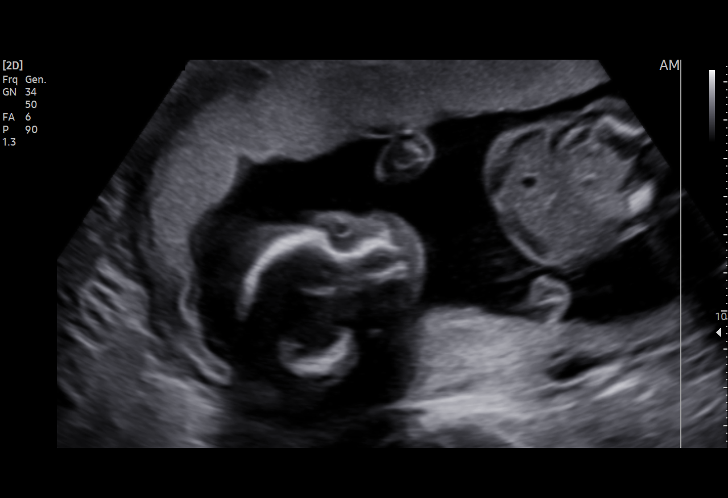
[im 81/92]
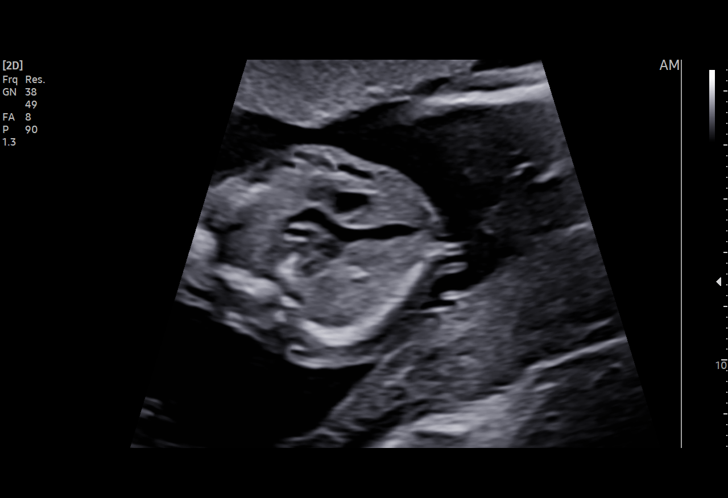
[im 88/92]
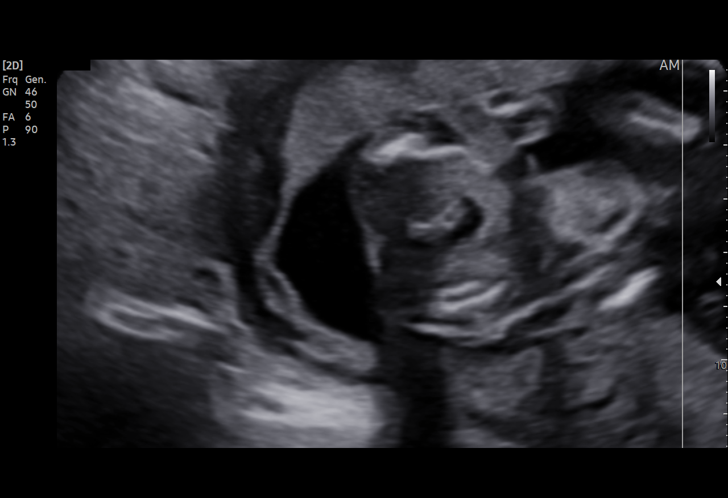

[13 of 28 positions shown; findings below may reference images not displayed]

[address]

Indications

 Obesity complicating pregnancy, second
 trimester (pre-g BMI: 31)
 19 weeks gestation of pregnancy
 Antenatal screening for malformations
 Velamentous insertion of umbilical cord
 LR-NIPS
Fetal Evaluation

 Num Of Fetuses:         1
 Fetal Heart Rate(bpm):  138
 Cardiac Activity:       Observed
 Presentation:           Oblique breech
 Placenta:               Anterior
 P. Cord Insertion:      Velamentous/marginal insertion

 Amniotic Fluid
 AFI FV:      Within normal limits
Biometry

 BPD:      45.4  mm     G. Age:  19w 5d         63  %    CI:        71.91   %    70 - 86
                                                         FL/HC:      17.6   %    16.1 -
 HC:      170.4  mm     G. Age:  19w 5d         53  %    HC/AC:      1.24        1.09 -
 AC:      137.9  mm     G. Age:  19w 1d         38  %    FL/BPD:     66.1   %
 FL:         30  mm     G. Age:  19w 2d         37  %    FL/AC:      21.8   %    20 - 24
 CER:      20.6  mm     G. Age:  19w 5d         67  %
 NFT:       3.9  mm
 LV:        5.2  mm
 CM:        4.9  mm

 Est. FW:     284  gm    0 lb 10 oz      37  %
OB History

 Gravidity:    2         Term:   1        Prem:   0        SAB:   0
 TOP:          0       Ectopic:  0        Living: 1
Gestational Age

 LMP:           20w 3d        Date:  11/16/21                  EDD:   08/23/22
 U/S Today:     19w 3d                                        EDD:   08/30/22
 Best:          19w 3d     Det. By:  Previous Ultrasound      EDD:   08/30/22
                                     (02/03/22)
Anatomy

 Cranium:               Appears normal         Aortic Arch:            Appears normal
 Cavum:                 Appears normal         Ductal Arch:            Appears normal
 Ventricles:            Appears normal         Diaphragm:              Appears normal
 Choroid Plexus:        Appears normal         Stomach:                Appears normal, left
                                                                       sided
 Cerebellum:            Appears normal         Abdomen:                Appears normal
 Posterior Fossa:       Appears normal         Abdominal Wall:         Appears nml (cord
                                                                       insert, abd wall)
 Nuchal Fold:           Appears normal         Cord Vessels:           Appears normal (3
                                                                       vessel cord)
 Face:                  Appears normal         Kidneys:                Appear normal
                        (orbits and profile)
 Lips:                  Appears normal         Bladder:                Appears normal
 Thoracic:              Appears normal         Spine:                  Appears normal
 Heart:                 Not well visualized    Upper Extremities:      Appears normal
 RVOT:                  Appears normal         Lower Extremities:      Appears normal
 LVOT:                  Appears normal

 Other:  VC, 3VV, heels/feet and hands/5th digits visualized. Nasal bone,
         lenses, maxilla, mandible and falx visualized Fetus appears to be
         female.
Cervix Uterus Adnexa

 Cervix
 Length:           5.06  cm.
 Normal appearance by transabdominal scan.

 Uterus
 No abnormality visualized.

 Right Ovary
 Size(cm)     2.29   x   2.66   x  1.87      Vol(ml):
 Within normal limits.

 Left Ovary
 Not visualized.
Impression
 G2 P1.  Patient is here for fetal anatomy scan.  On cell-free
 fetal DNA screening, the risks of fetal aneuploidies are not
 increased.
 Obstetrical history is significant for a term vaginal delivery in
 [id number] of a male infant.  Her son is in good health.
 Patient reports no chronic medical conditions.
 We performed a fetal anatomy scan. No markers of
 aneuploidies or fetal structural defects are seen. Fetal
 biometry is consistent with her previously-established dates.
 Amniotic fluid is normal and good fetal activity is seen.
 Velamentous/marginal cord insertion is seen.  It is fundal in
 location and there is no evidence of vasa previa.
 Patient understands the limitations of ultrasound in detecting
 fetal anomalies.
 As maternal obesity imposes limitations on the resolution of
 images, fetal anomalies may be missed.

 I explained the significance of velamentous/marginal cord
 insertion that it can be associated with fetal growth restriction
 in some cases.  We recommend serial fetal growth
 assessments till delivery.
Recommendations

 -An appointment was made for her to return in 5 weeks for
 completion of fetal anatomy.
                Siu, Farizah

## 2024-04-18 ENCOUNTER — Ambulatory Visit
Admission: EM | Admit: 2024-04-18 | Discharge: 2024-04-18 | Disposition: A | Discharge status: Home / Self Care | Attending: Emergency Medicine | Admitting: Emergency Medicine

## 2024-04-18 DIAGNOSIS — H66003 Acute suppurative otitis media without spontaneous rupture of ear drum, bilateral: Secondary | ICD-10-CM

## 2024-04-18 DIAGNOSIS — J0140 Acute pansinusitis, unspecified: Secondary | ICD-10-CM | POA: Diagnosis not present

## 2024-04-18 MED ORDER — AEROCHAMBER MV MISC: 1 refills | Status: AC

## 2024-04-18 MED ORDER — IBUPROFEN 600 MG PO TABS: 600.0000 mg | ORAL_TABLET | Freq: Three times a day (TID) | ORAL | 0 refills | Status: AC | PRN

## 2024-04-18 MED ORDER — AMOXICILLIN-POT CLAVULANATE 875-125 MG PO TABS: 1.0000 | ORAL_TABLET | Freq: Two times a day (BID) | ORAL | 0 refills | Status: AC

## 2024-04-18 MED ORDER — ALBUTEROL SULFATE HFA 108 (90 BASE) MCG/ACT IN AERS
1.0000 | INHALATION_SPRAY | RESPIRATORY_TRACT | 0 refills | Status: AC | PRN
Start: 2024-04-18 — End: ?

## 2024-04-18 MED ORDER — PREDNISONE 20 MG PO TABS: 40.0000 mg | ORAL_TABLET | Freq: Every day | ORAL | 0 refills | Status: AC

## 2024-04-18 MED ORDER — FLUTICASONE PROPIONATE 50 MCG/ACT NA SUSP: 2.0000 | Freq: Every day | NASAL | 0 refills | Status: AC

## 2024-04-18 NOTE — ED Triage Notes (Signed)
 Patient presents to UC for nasal drainage and SOB x 4 days. Treating with OTC sinus meds. Concerned with sinus infection.

## 2024-04-18 NOTE — ED Notes (Signed)
 Unable to get patient established with [facility] PCP since medicaid assigned her to [facility].

## 2024-04-18 NOTE — Discharge Instructions (Addendum)
 Finish the Augmentin , even if you feel better.  600 mg of ibuprofen , 1000 mg of Tylenol  3-4 times a day as needed for pain.  Prednisone will help with pain and swelling.  Try Flonase , Claritin or Zyrtec-D for the nasal congestion.  Saline nasal irrigation with a NeilMed sinus rinse and distilled water as often as you want.  2 puffs from your albuterol  inhaler using your spacer every 4-6 hours as needed for chest heaviness, shortness of breath.  If the spacer is too expensive at the pharmacy, you can get an AeroChamber Z-Stat off of [employer] for about $10-$15.  Go to www.goodrx.com  or www.costplusdrugs.com to look up your medications. This will give you a list of where you can find your prescriptions at the most affordable prices. Or ask the pharmacist what the cash price is, or if they have any other discount programs available to help make your medication more affordable. This can be less expensive than what you would pay with insurance.

## 2024-04-18 NOTE — ED Provider Notes (Signed)
 HPI  SUBJECTIVE:  Alexandra Olson is a 33 y.o. female who presents with 4 days of nasal congestion, yellow rhinorrhea, sinus pain and pressure, right upper dental pain, right ear and neck pain, tinnitus, postnasal drip, bilateral ear popping/crackling and sensation of fluid in her ears.  She reports vomiting secondary to postnasal drip. she reports sore throat for 3 days, but this has resolved.  She reports 4 days of constant, daily, chest heaviness accompanied with shortness of breath, dry cough.  She denies wheezing, dyspnea on exertion, chest pain, radiation of this chest heaviness up her neck, down her arm, through to her back.  There is no exertional component to it.  No hemoptysis, recent immobilization, surgery in the past 4 weeks, calf pain or swelling.  No antibiotics in the past 3 months.  No antipyretic in the past 6 hours.  She tried a drill, ibuprofen  800 mg every 4-6 hours with improvement in the nasal congestion/sinus pain and pressure and ear pain.  Symptoms are worse when the medications wear off.  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  No history of asthma, diabetes, hypertension, hypercholesterolemia, coronary disease, MI, CVA, PAD/PVD DVT/PE, exogenous estrogen.  Family history negative for early MI.  LMP: Now.  Denies possibility being pregnant.  PCP: None.    Past Medical History:  Diagnosis Date   Anxiety    GERD (gastroesophageal reflux disease)    Postpartum depression 01/01/2023    Past Surgical History:  Procedure Laterality Date   INSERTION OF NON VAGINAL CONTRACEPTIVE DEVICE Left 01/26/2023   Procedure: INSERTION OF NON VAGINAL CONTRACEPTIVE DEVICE;  Surgeon: Teresa Fender, MD;  Location: [facility];  Service: Gynecology;  Laterality: Left;   IUD REMOVAL N/A 01/26/2023   Procedure: HYSTEROSCOPY;  Surgeon: Teresa Fender, MD;  Location: [facility];  Service:  Gynecology;  Laterality: N/A;   LAPAROSCOPY  01/26/2023   Procedure: LAPAROSCOPY - OPERATIVE REMOVAL OF IUD;  Surgeon: Teresa Fender, MD;  Location: [facility];  Service: Gynecology;;   WISDOM TOOTH EXTRACTION  12/2015    Family History  Problem Relation Age of Onset   Diabetes Maternal Grandfather    Diabetes Paternal Grandfather    Breast cancer Maternal Aunt     Social History   Tobacco Use   Smoking status: Former    Current packs/day: 0.20    Types: Cigarettes   Smokeless tobacco: Never  Vaping Use   Vaping status: Former  Substance Use Topics   Alcohol use: Not Currently    Comment: every once in a while   Drug use: Not Currently    Frequency: 1.0 times per week    Types: Marijuana    No current facility-administered medications for this encounter.  Current Outpatient Medications:    albuterol  (VENTOLIN  HFA) 108 (90 Base) MCG/ACT inhaler, Inhale 1-2 puffs into the lungs every 4 (four) hours as needed for wheezing or shortness of breath., Disp: 1 each, Rfl: 0   amoxicillin -clavulanate (AUGMENTIN ) 875-125 MG tablet, Take 1 tablet by mouth every 12 (twelve) hours., Disp: 14 tablet, Rfl: 0   fluticasone  (FLONASE ) 50 MCG/ACT nasal spray, Place 2 sprays into both nostrils daily., Disp: 16 g, Rfl: 0   ibuprofen  (ADVIL ) 600 MG tablet, Take 1 tablet (600 mg total) by mouth every 8 (eight) hours as needed., Disp: 30 tablet, Rfl: 0   predniSONE (DELTASONE) 20 MG tablet, Take 2 tablets (40  mg total) by mouth daily with breakfast for 5 days., Disp: 10 tablet, Rfl: 0   Spacer/Aero-Holding Chambers (AEROCHAMBER MV) inhaler, Use as instructed, Disp: 1 each, Rfl: 1   cetirizine (ZYRTEC) 10 MG tablet, Take 10 mg by mouth daily., Disp: , Rfl:    escitalopram  (LEXAPRO ) 10 MG tablet, Take 1 tablet (10 mg total) by mouth daily. Start with 1/2 tablet daily for 2 weeks, then increase to 1 tablet daily., Disp: 90 tablet, Rfl: 3  Allergies  Allergen Reactions   Zoloft [Sertraline Hcl] Other (See  Comments)    Extreme migraines Extreme migraines     ROS  As noted in HPI.   Physical Exam  BP (!) 155/100 (BP Location: Left Arm)   Pulse 80   Temp 98.4 F (36.9 C) (Oral)   Resp 16   LMP 11/04/2023 (Approximate)   SpO2 97%   Breastfeeding No   Constitutional: Well developed, well nourished, no acute distress Eyes: PERRL, EOMI, conjunctiva normal bilaterally HENT: Normocephalic, atraumatic,mucus membranes moist.  Bilateral TMs erythematous.  Right TM dull, bulging.  Purulent nasal congestion.  Right frontal, maxillary sinus tenderness.  Normal tonsils without exudates.  Normal oropharynx.  Uvula midline. Neck: Positive right-sided cervical lymphadenopathy Respiratory: Clear to auscultation bilaterally, no rales, no wheezing, no rhonchi.  No anterior, lateral chest wall tenderness Cardiovascular: Normal rate and rhythm, no murmurs, no gallops, no rubs GI: nondistended skin: No rash, skin intact Musculoskeletal: Calf symmetric, nontender, no edema Neurologic: Alert & oriented x 3, CN III-XII grossly intact, no motor deficits, sensation grossly intact Psychiatric: Speech and behavior appropriate   ED Course   Medications - No data to display  Orders Placed This Encounter  Procedures   Nursing Communication Please set up with a PCP prior to discharge    Please set up with a PCP prior to discharge    Standing Status:   Standing    Number of Occurrences:   1   ED EKG    Standing Status:   Standing    Number of Occurrences:   1    Reason for Exam:   Shortness of breath   No results found for this or any previous visit (from the past 24 hours). No results found.  ED Clinical Impression  1. Acute non-recurrent pansinusitis   2. Non-recurrent acute suppurative otitis media of both ears without spontaneous rupture of tympanic membranes      ED Assessment/Plan     Will check EKG due to the chest heaviness/shortness of breath.  Her lungs are clear.  She has no  anterior, lateral chest wall tenderness, signs of DVT.  PERC criteria negative.  Doubt PE.  EKG: Normal sinus rhythm, rate 76 normal axis, normal walls.  No hypertrophy.  No ST-T wave changes.  Patient symptomatic while EKG was obtained.  No previous EKG for comparison.  HEART score:   History: Slightly suspicious 0 EKG: Normal 0 Age: Below 45 0 Risk factors: 1.  Smoking. Troponin: Not available  Total score 1.  Patient is at low risk of MACE.  ACS low in the differential for her chest heaviness/shortness of breath.  Lungs clear, she is not hypoxic, tachycardic, no history of favors, doubt pneumonia.  Augmentin  will cover pneumonia.  Deferring x-ray today.  Patient presents with a bilateral otitis media and sinusitis.  She also appears to have some eustachian tube dysfunction.  Home with Augmentin  for 7 days, prednisone 40 mg for 5 days, Flonase , saline nasal irrigation, Claritin or Zyrtec-D  since she states Benadryl  is helping with the nasal congestion/sinus pain and pressure and that her allergies bother her around this time of year.  We can try an albuterol  inhaler with a spacer every 4-6 hours as needed to see if this helps.  [facility]up [facility] if symptoms persist.  Strict ER return precautions given.  Discussed MDM, treatment plan, and plan for follow-up with patient. Discussed sn/sx that should prompt return to the ED. patient agrees with plan.   Meds ordered this encounter  Medications   fluticasone  (FLONASE ) 50 MCG/ACT nasal spray    Sig: Place 2 sprays into both nostrils daily.    Dispense:  16 g    Refill:  0   ibuprofen  (ADVIL ) 600 MG tablet    Sig: Take 1 tablet (600 mg total) by mouth every 8 (eight) hours as needed.    Dispense:  30 tablet    Refill:  0   amoxicillin -clavulanate (AUGMENTIN ) 875-125 MG tablet    Sig: Take 1 tablet by mouth every 12 (twelve) hours.    Dispense:  14 tablet    Refill:  0   albuterol  (VENTOLIN  HFA) 108 (90 Base) MCG/ACT  inhaler    Sig: Inhale 1-2 puffs into the lungs every 4 (four) hours as needed for wheezing or shortness of breath.    Dispense:  1 each    Refill:  0   Spacer/Aero-Holding Chambers (AEROCHAMBER MV) inhaler    Sig: Use as instructed    Dispense:  1 each    Refill:  1   predniSONE (DELTASONE) 20 MG tablet    Sig: Take 2 tablets (40 mg total) by mouth daily with breakfast for 5 days.    Dispense:  10 tablet    Refill:  0      *This clinic note was created using Scientist, clinical (histocompatibility and immunogenetics). Therefore, there may be occasional mistakes despite careful proofreading. ?    Ethlyn Herd, MD 04/19/24 1510
